# Patient Record
Sex: Male | Born: 1965 | Race: Black or African American | Hispanic: No | Marital: Married | Smoking: Former smoker
Health system: Southern US, Community
[De-identification: ages and names within clinical notes are randomized; demographics above are authoritative.]

## PROBLEM LIST (undated history)

## (undated) DIAGNOSIS — Z973 Presence of spectacles and contact lenses: Secondary | ICD-10-CM

## (undated) DIAGNOSIS — M542 Cervicalgia: Secondary | ICD-10-CM

## (undated) DIAGNOSIS — M549 Dorsalgia, unspecified: Secondary | ICD-10-CM

## (undated) DIAGNOSIS — J45909 Unspecified asthma, uncomplicated: Secondary | ICD-10-CM

## (undated) DIAGNOSIS — M1990 Unspecified osteoarthritis, unspecified site: Secondary | ICD-10-CM

## (undated) DIAGNOSIS — G8929 Other chronic pain: Secondary | ICD-10-CM

## (undated) DIAGNOSIS — T7840XA Allergy, unspecified, initial encounter: Secondary | ICD-10-CM

## (undated) DIAGNOSIS — D869 Sarcoidosis, unspecified: Secondary | ICD-10-CM

## (undated) DIAGNOSIS — K449 Diaphragmatic hernia without obstruction or gangrene: Secondary | ICD-10-CM

## (undated) HISTORY — DX: Cervicalgia: M54.2

## (undated) HISTORY — DX: Allergy, unspecified, initial encounter: T78.40XA

## (undated) HISTORY — DX: Presence of spectacles and contact lenses: Z97.3

## (undated) HISTORY — DX: Diaphragmatic hernia without obstruction or gangrene: K44.9

## (undated) HISTORY — DX: Unspecified osteoarthritis, unspecified site: M19.90

## (undated) HISTORY — DX: Other chronic pain: G89.29

## (undated) HISTORY — PX: CIRCUMCISION: SUR203

## (undated) HISTORY — DX: Dorsalgia, unspecified: M54.9

---

## 1996-06-18 DIAGNOSIS — D869 Sarcoidosis, unspecified: Secondary | ICD-10-CM

## 1996-06-18 HISTORY — DX: Sarcoidosis, unspecified: D86.9

## 2010-07-11 ENCOUNTER — Encounter
Admission: RE | Admit: 2010-07-11 | Discharge: 2010-07-11 | Payer: Self-pay | Source: Home / Self Care | Attending: Specialist | Admitting: Specialist

## 2012-06-29 IMAGING — CR DG CERVICAL SPINE COMPLETE 4+V
5 series · 5 of 5 positions shown · non-contrast
Comparison: None

CLINICAL DATA: Neck pain and left arm numbness.

CERVICAL SPINE - COMPLETE 4+ VIEW

[w c-spine lat]
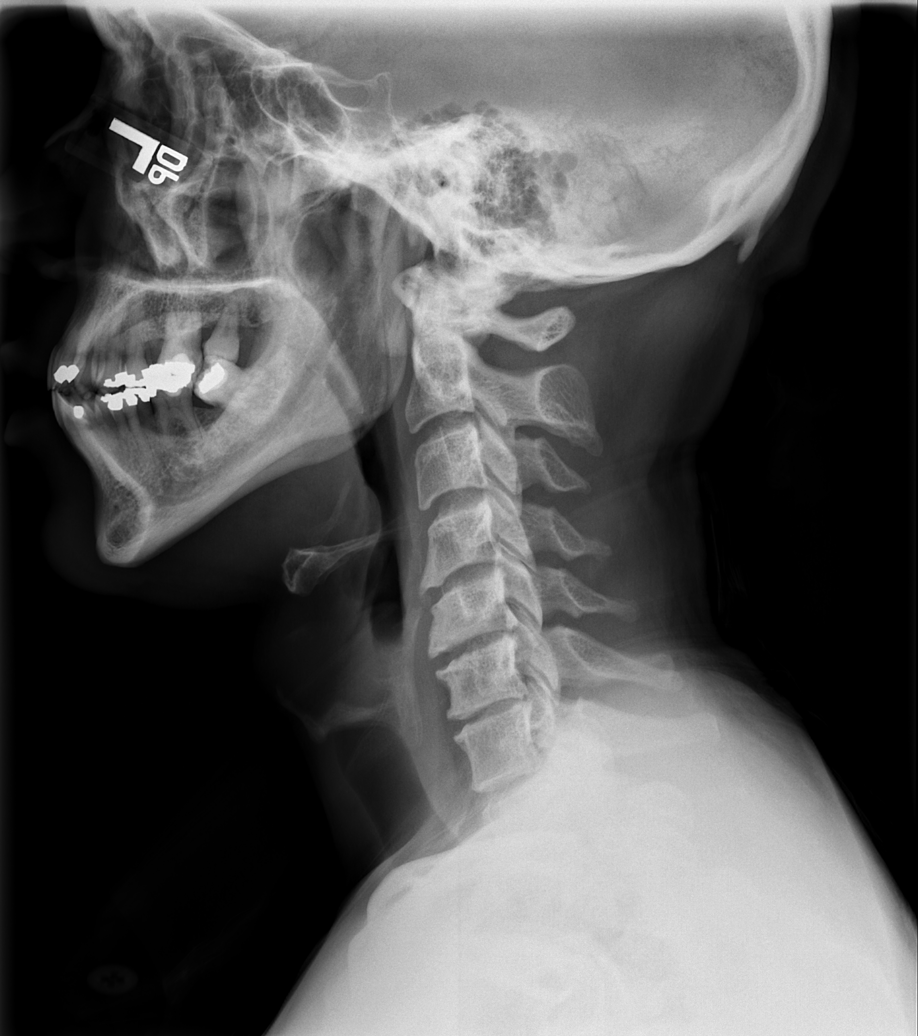

[w c-spine oblique (1 of 2)]
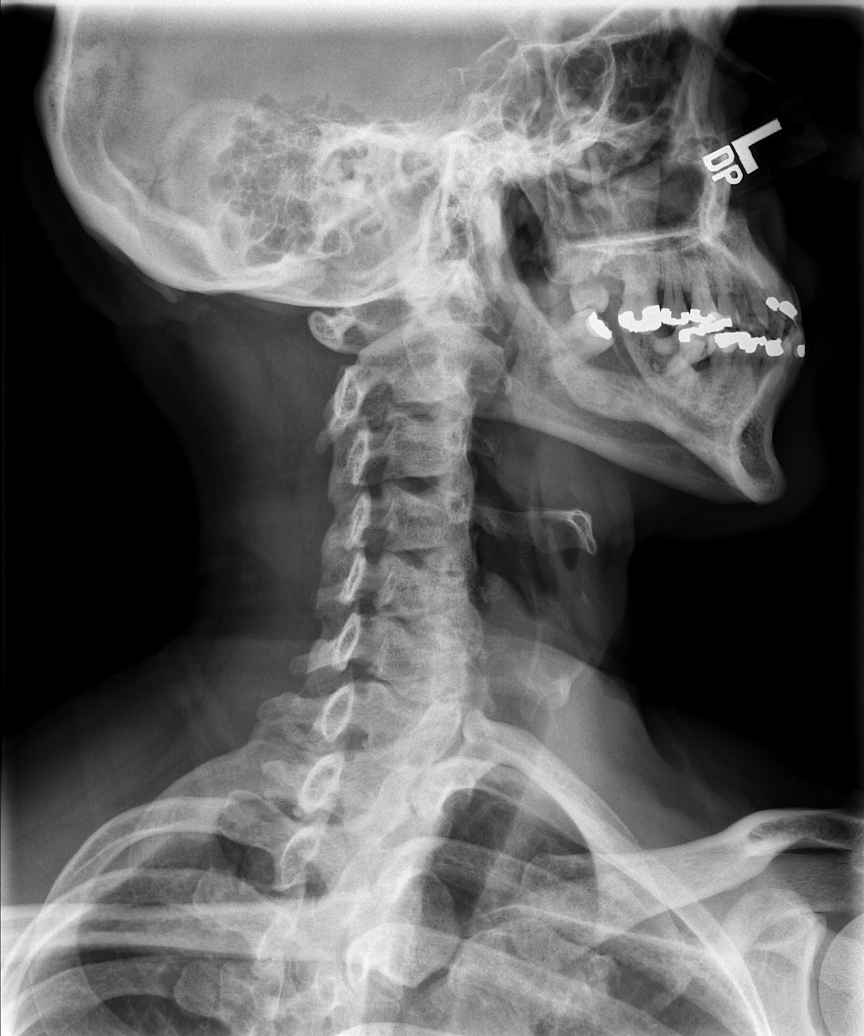

[w c-spine oblique (2 of 2)]
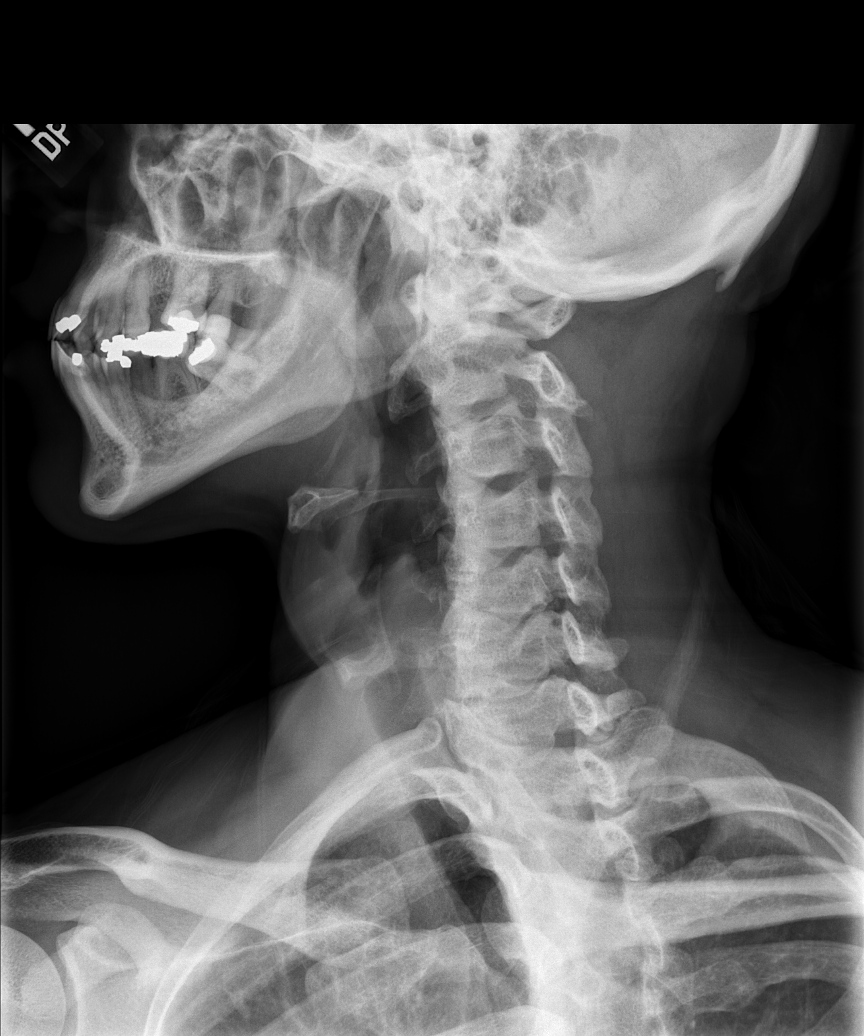

[w c-spine a.p. *]
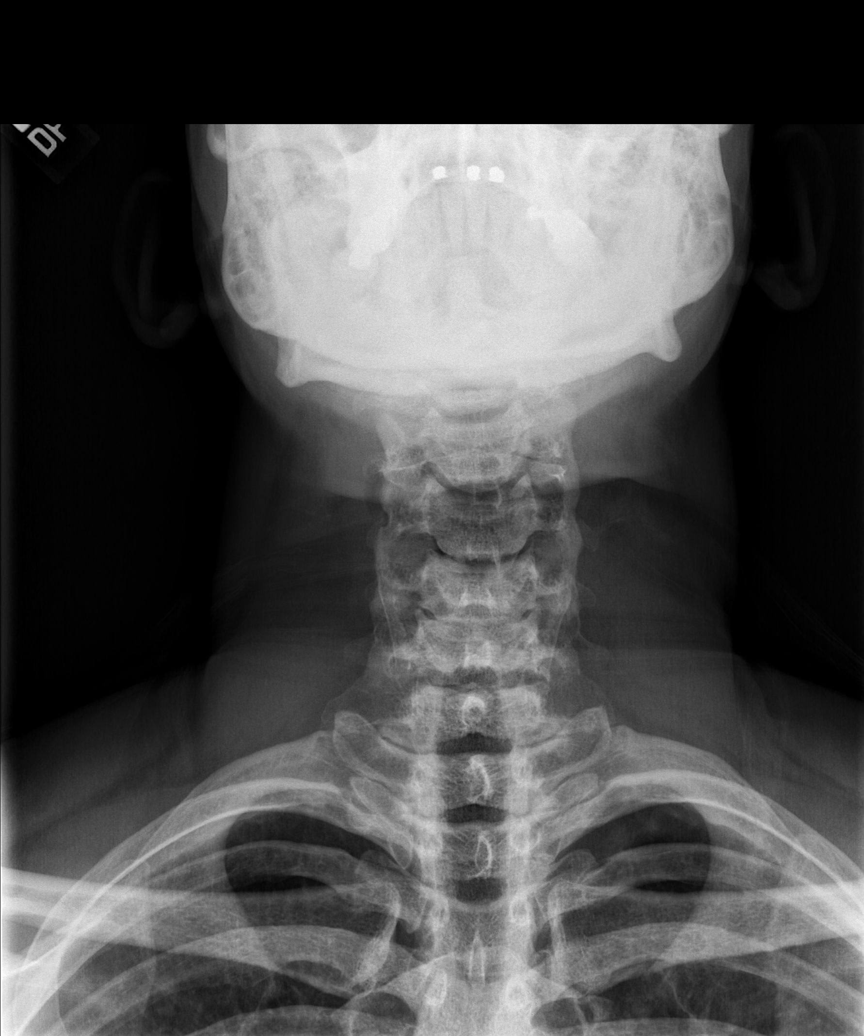

[w c-spine odontoid *]
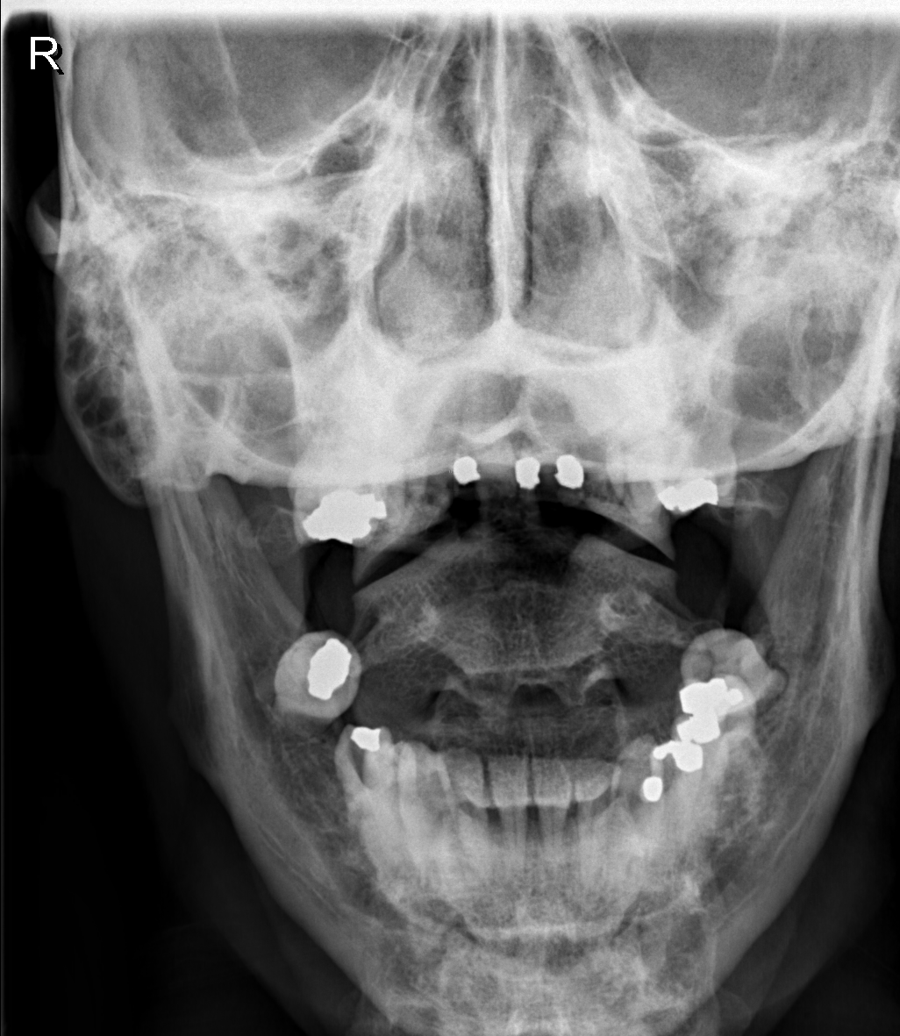

[5 of 5 positions shown; findings below may reference images not displayed]

FINDINGS: Moderate degenerative cervical spondylosis for age with
disc disease and facet disease most notable at C4-5, C5-6 and C6-7.
The facets are normally aligned.  Neural foramen are grossly
patent.  The C1-2 articulations are maintained.  Small cervical
ribs are noted.  The lung apices are clear.
IMPRESSION: Degenerative cervical spondylosis but no acute bony findings.
Uncinate spurring changes at C5-6 and C6-7 could contribute to
foraminal narrowing.

## 2015-06-19 HISTORY — PX: COLONOSCOPY: SHX174

## 2015-12-27 ENCOUNTER — Ambulatory Visit (INDEPENDENT_AMBULATORY_CARE_PROVIDER_SITE_OTHER): Payer: 59

## 2015-12-27 ENCOUNTER — Encounter (HOSPITAL_COMMUNITY): Payer: Self-pay | Admitting: Emergency Medicine

## 2015-12-27 ENCOUNTER — Ambulatory Visit (HOSPITAL_COMMUNITY)
Admission: EM | Admit: 2015-12-27 | Discharge: 2015-12-27 | Disposition: A | Discharge status: Home / Self Care | Payer: 59 | Attending: Family Medicine | Admitting: Family Medicine

## 2015-12-27 DIAGNOSIS — Z79899 Other long term (current) drug therapy: Secondary | ICD-10-CM | POA: Diagnosis not present

## 2015-12-27 DIAGNOSIS — K449 Diaphragmatic hernia without obstruction or gangrene: Secondary | ICD-10-CM | POA: Diagnosis not present

## 2015-12-27 DIAGNOSIS — R1013 Epigastric pain: Secondary | ICD-10-CM | POA: Diagnosis present

## 2015-12-27 DIAGNOSIS — Z833 Family history of diabetes mellitus: Secondary | ICD-10-CM | POA: Diagnosis not present

## 2015-12-27 DIAGNOSIS — Z7722 Contact with and (suspected) exposure to environmental tobacco smoke (acute) (chronic): Secondary | ICD-10-CM | POA: Diagnosis not present

## 2015-12-27 HISTORY — DX: Unspecified asthma, uncomplicated: J45.909

## 2015-12-27 HISTORY — DX: Sarcoidosis, unspecified: D86.9

## 2015-12-27 MED ORDER — ALBUTEROL SULFATE HFA 108 (90 BASE) MCG/ACT IN AERS
1.0000 | INHALATION_SPRAY | Freq: Four times a day (QID) | RESPIRATORY_TRACT | Status: DC | PRN
Start: 2015-12-27 — End: 2016-01-18

## 2015-12-27 MED ORDER — FLUTICASONE-SALMETEROL 250-50 MCG/DOSE IN AEPB: 1.0000 | INHALATION_SPRAY | Freq: Two times a day (BID) | RESPIRATORY_TRACT | Status: DC

## 2015-12-27 MED ORDER — MONTELUKAST SODIUM 10 MG PO TABS: 10.0000 mg | ORAL_TABLET | Freq: Every day | ORAL | Status: DC

## 2015-12-27 NOTE — Discharge Instructions (Signed)
Hiatal Hernia  A hiatal hernia occurs when part of your stomach slides above the muscle that separates your abdomen from your chest (diaphragm). You can be born with a hiatal hernia (congenital), or it may develop over time. In almost all cases of hiatal hernia, only the top part of the stomach pushes through.   Many people have a hiatal hernia with no symptoms. The larger the hernia, the more likely that you will have symptoms. In some cases, a hiatal hernia allows stomach acid to flow back into the tube that carries food from your mouth to your stomach (esophagus). This may cause heartburn symptoms. Severe heartburn symptoms may mean you have developed a condition called gastroesophageal reflux disease (GERD).   CAUSES   Hiatal hernias are caused by a weakness in the opening (hiatus) where your esophagus passes through your diaphragm to attach to the upper part of your stomach. You may be born with a weakness in your hiatus, or a weakness can develop.  RISK FACTORS  Older age is a major risk factor for a hiatal hernia. Anything that increases pressure on your diaphragm can also increase your risk of a hiatal hernia. This includes:  · Pregnancy.  · Excess weight.  · Frequent constipation.  SIGNS AND SYMPTOMS   People with a hiatal hernia often have no symptoms. If symptoms develop, they are almost always caused by GERD. They may include:  · Heartburn.  · Belching.  · Indigestion.  · Trouble swallowing.  · Coughing or wheezing.   · Sore throat.  · Hoarseness.  · Chest pain.  DIAGNOSIS   A hiatal hernia is sometimes found during an exam for another problem. Your health care provider may suspect a hiatal hernia if you have symptoms of GERD. Tests may be done to diagnose GERD. These may include:  · X-rays of your stomach or chest.  · An upper gastrointestinal (GI) series. This is an X-ray exam of your GI tract involving the use of a chalky liquid that you swallow. The liquid shows up clearly on the X-ray.  · Endoscopy.  This is a procedure to look into your stomach using a thin, flexible tube that has a tiny camera and light on the end of it.  TREATMENT   If you have no symptoms, you may not need treatment. If you have symptoms, treatment may include:  · Dietary and lifestyle changes to help reduce GERD symptoms.  · Medicines. These may include:    Over-the-counter antacids.    Medicines that make your stomach empty more quickly.    Medicines that block the production of stomach acid (H2 blockers).    Stronger medicines to reduce stomach acid (proton pump inhibitors).  · You may need surgery to repair the hernia if other treatments are not helping.  HOME CARE INSTRUCTIONS   · Take all medicines as directed by your health care provider.  · Quit smoking, if you smoke.  · Try to achieve and maintain a healthy body weight.  · Eat frequent small meals instead of three large meals a day. This keeps your stomach from getting too full.    Eat slowly.    Do not lie down right after eating.     Do not eat 1-2 hours before bed.    · Do not drink beverages with caffeine. These include cola, coffee, cocoa, and tea.  · Do not drink alcohol.  · Avoid foods that can make symptoms of GERD worse. These may include:    Fatty foods.      Citrus fruits.    Other foods and drinks that contain acid.  · Avoid putting pressure on your belly. Anything that puts pressure on your belly increases the amount of acid that may be pushed up into your esophagus.      Avoid bending over, especially after eating.    Raise the head of your bed by putting blocks under the legs. This keeps your head and esophagus higher than your stomach.    Do not wear tight clothing around your chest or stomach.    Try not to strain when having a bowel movement, when urinating, or when lifting heavy objects.  SEEK MEDICAL CARE IF:  · Your symptoms are not controlled with medicines or lifestyle changes.  · You are having trouble swallowing.  · You have coughing or wheezing that will not  go away.  SEEK IMMEDIATE MEDICAL CARE IF:  · Your pain is getting worse.  · Your pain spreads to your arms, neck, jaw, teeth, or back.  · You have shortness of breath.  · You sweat for no reason.  · You feel sick to your stomach (nauseous) or vomit.  · You vomit blood.  · You have bright red blood in your stools.  · You have black, tarry stools.       This information is not intended to replace advice given to you by your health care provider. Make sure you discuss any questions you have with your health care provider.     Document Released: 08/25/2003 Document Revised: 06/25/2014 Document Reviewed: 05/22/2013  Elsevier Interactive Patient Education ©2016 [employer]

## 2015-12-27 NOTE — ED Provider Notes (Signed)
CSN: [id number]     Arrival date & time 12/27/15  0957 History   First MD Initiated Contact with Patient 12/27/15 1025     Chief Complaint  Patient presents with  . Chest Pain  . Medication Refill   (Consider location/radiation/quality/duration/timing/severity/associated sxs/prior Treatment) HPI History obtained from patient:  Pt presents with the cc of:  Upper epigastric pain Duration of symptoms: 3 weeks Treatment prior to arrival: None Context: Onset of upper epigastric pain 3 weeks ago comes and goes does not keep him from doing any of his normal activities I related to shortness of breath or chest pain. Other symptoms include: Some increased in normal heartburn Pain score: 1 FAMILY HISTORY: Diabetes     Past Medical History  Diagnosis Date  . Asthma   . Sarcoidosis (HCC)    History reviewed. No pertinent past surgical history. Family History  Problem Relation Age of Onset  . Asthma Mother   . Diabetes Mother    Social History  Substance Use Topics  . Smoking status: Passive Smoke Exposure - Never Smoker  . Smokeless tobacco: None  . Alcohol Use: Yes     Comment: occassionally    Review of Systems  Denies: HEADACHE, NAUSEA, ABDOMINAL PAIN, CHEST PAIN, CONGESTION, DYSURIA, SHORTNESS OF BREATH  Allergies  Review of patient's allergies indicates no known allergies.  Home Medications   Prior to Admission medications   Medication Sig Start Date End Date Taking? Authorizing Provider  albuterol (PROVENTIL HFA;VENTOLIN HFA) 108 (90 Base) MCG/ACT inhaler Inhale into the lungs every 6 (six) hours as needed for wheezing or shortness of breath.    Historical Provider, MD  albuterol (PROVENTIL HFA;VENTOLIN HFA) 108 (90 Base) MCG/ACT inhaler Inhale 1-2 puffs into the lungs every 6 (six) hours as needed for wheezing or shortness of breath. 12/27/15   Tharon Aquas, PA  Fluticasone-Salmeterol (ADVAIR DISKUS) 250-50 MCG/DOSE AEPB Inhale 1 puff into the lungs 2 (two) times  daily. 12/27/15   Tharon Aquas, PA  Fluticasone-Salmeterol (ADVAIR) 250-50 MCG/DOSE AEPB Inhale 1 puff into the lungs 2 (two) times daily.    Historical Provider, MD  montelukast (SINGULAIR) 10 MG tablet Take 10 mg by mouth at bedtime.    Historical Provider, MD  montelukast (SINGULAIR) 10 MG tablet Take 1 tablet (10 mg total) by mouth at bedtime. 12/27/15   Tharon Aquas, PA   Meds Ordered and Administered this Visit  Medications - No data to display  BP 113/76 mmHg  Pulse 79  Temp(Src) 98.2 F (36.8 C) (Oral)  Resp 18  SpO2 97% No data found.   Physical Exam NURSES NOTES AND VITAL SIGNS REVIEWED. CONSTITUTIONAL: Well developed, well nourished, no acute distress HEENT: normocephalic, atraumatic EYES: Conjunctiva normal NECK:normal ROM, supple, no adenopathy PULMONARY:No respiratory distress, normal effort ABDOMINAL: Soft, ND, NT BS+, No CVAT MUSCULOSKELETAL: Normal ROM of all extremities,  SKIN: warm and dry without rash PSYCHIATRIC: Mood and affect, behavior are normal  ED Course  Procedures (including critical care time)  Labs Review Labs Reviewed - No data to display  Imaging Review Dg Chest 2 View  12/27/2015  CLINICAL DATA:  Chest pain off and on for 3 weeks. EXAM: CHEST  2 VIEW COMPARISON:  None. FINDINGS: The lungs are clear wiithout focal pneumonia, edema, pneumothorax or pleural effusion. The cardiopericardial silhouette is within normal limits for size. The visualized bony structures of the thorax are intact. IMPRESSION: No active cardiopulmonary disease. Electronically Signed   By: Jamison Oka.D.  On: 12/27/2015 11:22   dISCUSSED WITH PATIENT PRIOR TO DISCHARGE  Visual Acuity Review  Right Eye Distance:   Left Eye Distance:   Bilateral Distance:    Right Eye Near:   Left Eye Near:    Bilateral Near:         MDM   1. Hiatal hernia     Patient is reassured that there are no issues that require transfer to higher level of care at this time  or additional tests. Patient is advised to continue home symptomatic treatment. Patient is advised that if there are new or worsening symptoms to attend the emergency department, contact primary care provider, or return to UC. Instructions of care provided discharged home in stable condition.    THIS NOTE WAS GENERATED USING A VOICE RECOGNITION SOFTWARE PROGRAM. ALL REASONABLE EFFORTS  WERE MADE TO PROOFREAD THIS DOCUMENT FOR ACCURACY.  I have verbally reviewed the discharge instructions with the patient. A printed AVS was given to the patient.  All questions were answered prior to discharge.      Tharon AquasFrank C Patrick, PA 12/27/15 1154

## 2015-12-27 NOTE — ED Notes (Signed)
[facility] a [facility] of sternal chest pain that he describes as faint tightness that changes slightly with inspiration. The patient stated that the chest pain started about 3 weeks ago and happens off and on. The patient also requested that his asthma medications be refilled. The patient also reported a hx of sarcoidosis.

## 2016-01-17 DIAGNOSIS — M542 Cervicalgia: Secondary | ICD-10-CM

## 2016-01-17 HISTORY — DX: Cervicalgia: M54.2

## 2016-01-18 ENCOUNTER — Encounter: Payer: Self-pay | Admitting: Medical

## 2016-01-18 ENCOUNTER — Ambulatory Visit (INDEPENDENT_AMBULATORY_CARE_PROVIDER_SITE_OTHER): Payer: 59 | Admitting: Medical

## 2016-01-18 ENCOUNTER — Encounter: Payer: Self-pay | Admitting: Internal Medicine

## 2016-01-18 VITALS — BP 110/76 | HR 70 | Ht 70.25 in | Wt 185.0 lb

## 2016-01-18 DIAGNOSIS — Z1211 Encounter for screening for malignant neoplasm of colon: Secondary | ICD-10-CM

## 2016-01-18 DIAGNOSIS — J309 Allergic rhinitis, unspecified: Secondary | ICD-10-CM | POA: Insufficient documentation

## 2016-01-18 DIAGNOSIS — M542 Cervicalgia: Secondary | ICD-10-CM | POA: Diagnosis not present

## 2016-01-18 DIAGNOSIS — M549 Dorsalgia, unspecified: Secondary | ICD-10-CM | POA: Diagnosis not present

## 2016-01-18 DIAGNOSIS — J4540 Moderate persistent asthma, uncomplicated: Secondary | ICD-10-CM | POA: Diagnosis not present

## 2016-01-18 DIAGNOSIS — Z862 Personal history of diseases of the blood and blood-forming organs and certain disorders involving the immune mechanism: Secondary | ICD-10-CM | POA: Insufficient documentation

## 2016-01-18 DIAGNOSIS — K449 Diaphragmatic hernia without obstruction or gangrene: Secondary | ICD-10-CM | POA: Diagnosis not present

## 2016-01-18 DIAGNOSIS — Z0000 Encounter for general adult medical examination without abnormal findings: Secondary | ICD-10-CM | POA: Diagnosis not present

## 2016-01-18 DIAGNOSIS — J45909 Unspecified asthma, uncomplicated: Secondary | ICD-10-CM | POA: Insufficient documentation

## 2016-01-18 DIAGNOSIS — G8929 Other chronic pain: Secondary | ICD-10-CM | POA: Insufficient documentation

## 2016-01-18 DIAGNOSIS — Z125 Encounter for screening for malignant neoplasm of prostate: Secondary | ICD-10-CM | POA: Insufficient documentation

## 2016-01-18 DIAGNOSIS — Z7721 Contact with and (suspected) exposure to potentially hazardous body fluids: Secondary | ICD-10-CM | POA: Diagnosis not present

## 2016-01-18 LAB — POCT URINALYSIS DIPSTICK
Bilirubin, UA: NEGATIVE
Blood, UA: NEGATIVE
GLUCOSE UA: NEGATIVE
Ketones, UA: NEGATIVE
Leukocytes, UA: NEGATIVE
NITRITE UA: NEGATIVE
PROTEIN UA: NEGATIVE
SPEC GRAV UA: 1.025
UROBILINOGEN UA: NEGATIVE
pH, UA: 6.5

## 2016-01-18 LAB — COMPREHENSIVE METABOLIC PANEL
ALBUMIN: 4.4 g/dL (ref 3.6–5.1)
ALT: 23 U/L (ref 9–46)
AST: 21 U/L (ref 10–35)
Alkaline Phosphatase: 48 U/L (ref 40–115)
BUN: 11 mg/dL (ref 7–25)
CHLORIDE: 100 mmol/L (ref 98–110)
CO2: 28 mmol/L (ref 20–31)
CREATININE: 1.09 mg/dL (ref 0.70–1.33)
Calcium: 9.3 mg/dL (ref 8.6–10.3)
Glucose, Bld: 75 mg/dL (ref 65–99)
POTASSIUM: 4 mmol/L (ref 3.5–5.3)
SODIUM: 137 mmol/L (ref 135–146)
Total Bilirubin: 0.8 mg/dL (ref 0.2–1.2)
Total Protein: 7.8 g/dL (ref 6.1–8.1)

## 2016-01-18 LAB — CBC
HCT: 43.8 % (ref 38.5–50.0)
Hemoglobin: 14.9 g/dL (ref 13.2–17.1)
MCH: 30.2 pg (ref 27.0–33.0)
MCHC: 34 g/dL (ref 32.0–36.0)
MCV: 88.7 fL (ref 80.0–100.0)
MPV: 10.4 fL (ref 7.5–12.5)
PLATELETS: 251 10*3/uL (ref 140–400)
RBC: 4.94 MIL/uL (ref 4.20–5.80)
RDW: 13.8 % (ref 11.0–15.0)
WBC: 4.6 10*3/uL (ref 4.0–10.5)

## 2016-01-18 LAB — LIPID PANEL
CHOL/HDL RATIO: 2.4 ratio (ref <=5.0)
Cholesterol: 173 mg/dL (ref 125–200)
HDL: 71 mg/dL (ref >=40)
LDL CALC: 89 mg/dL (ref <130)
TRIGLYCERIDES: 65 mg/dL (ref <150)
VLDL: 13 mg/dL (ref <30)

## 2016-01-18 MED ORDER — ALBUTEROL SULFATE HFA 108 (90 BASE) MCG/ACT IN AERS: 2.0000 | INHALATION_SPRAY | Freq: Four times a day (QID) | RESPIRATORY_TRACT | 1 refills | Status: DC | PRN

## 2016-01-18 MED ORDER — FLUTICASONE-SALMETEROL 250-50 MCG/DOSE IN AEPB: 1.0000 | INHALATION_SPRAY | Freq: Two times a day (BID) | RESPIRATORY_TRACT | 11 refills | Status: DC

## 2016-01-18 MED ORDER — MONTELUKAST SODIUM 10 MG PO TABS: 10.0000 mg | ORAL_TABLET | Freq: Every day | ORAL | 3 refills | Status: DC

## 2016-01-18 NOTE — Progress Notes (Signed)
Subjective:   HPI  Seth Ferguson is a 50 y.o. male who presents for a complete physical.  New patient today.  Was seeing Dr. Mayford Knife prior but he disappeared, can't seem to make contact with the office that seems to be closed.   Concerns: Asthma - controlled on Advair, Singulair, no particular c/o, no recent flare ups.  Can get flare up with illness, heavy exercise.  Has hx/o chronic neck and back pain, not debilitating.   Works full time as Sports administrator facility. Thinks vaccines for hepatitis and Td up to date per work.  Also gets some labs from time to time at work  Went to urgent care recently for chest pain, was diagnosed with hiatal hernia.  Not currently having problems with this.  Reviewed their medical, surgical, family, social, medication, and allergy history and updated chart as appropriate.  Past Medical History:  Diagnosis Date  . Allergy   . Asthma   . Chronic back pain    self reported 01/2016  . Hiatal hernia   . Neck pain 01/2016   self reported  . Sarcoidosis (HCC) 1998   ?  Marland Kitchen Wears glasses     Past Surgical History:  Procedure Laterality Date  . NO PAST SURGERIES  01/2016    Social History   Social History  . Marital status: Married    Spouse name: N/A  . Number of children: N/A  . Years of education: N/A   Occupational History  . Not on file.   Social History Main Topics  . Smoking status: Passive Smoke Exposure - Never Smoker  . Smokeless tobacco: Never Used  . Alcohol use Yes     Comment: occassionally  . Drug use: No  . Sexual activity: Not on file   Other Topics Concern  . Not on file   Social History Narrative   Originally from [place], married, has 4 children, 2 boys still living at home, exercise  Active on the job, uses stairs, cuts grass, yard work.   Designer, television/film set at [employer], prior Music therapist.  As of 01/2016    Family History  Problem Relation Age of Onset  . Asthma Mother   . Diabetes Mother   . Other  Mother     died of "broken heart" per son  . Stroke Mother   . Seizures Mother   . Other Father     lung disease?  Marland Kitchen Pneumonia Father   . Asthma Sister   . Diabetes Sister   . Asthma Brother   . Heart disease Neg Hx   . Hypertension Neg Hx   . Cancer Neg Hx      Current Outpatient Prescriptions:  .  Fluticasone-Salmeterol (ADVAIR DISKUS) 250-50 MCG/DOSE AEPB, Inhale 1 puff into the lungs 2 (two) times daily., Disp: 60 each, Rfl: 11 .  montelukast (SINGULAIR) 10 MG tablet, Take 1 tablet (10 mg total) by mouth at bedtime., Disp: 90 tablet, Rfl: 3 .  albuterol (PROVENTIL HFA;VENTOLIN HFA) 108 (90 Base) MCG/ACT inhaler, Inhale 2 puffs into the lungs every 6 (six) hours as needed for wheezing or shortness of breath., Disp: 18 g, Rfl: 1  No Known Allergies    Review of Systems Constitutional: -fever, -chills, -sweats, -unexpected weight change, -decreased appetite, -fatigue Allergy: -sneezing, -itching, -congestion Dermatology: -changing moles, --rash, -lumps ENT: -runny nose, -ear pain, -sore throat, -hoarseness, -sinus pain, -teeth pain, - ringing in ears, -hearing loss, -nosebleeds Cardiology: -chest pain, -palpitations, -swelling, -difficulty breathing when lying flat, -  waking up short of breath Respiratory: -cough, -shortness of breath, -difficulty breathing with exercise or exertion, -wheezing, -coughing up blood Gastroenterology: -abdominal pain, -nausea, -vomiting, -diarrhea, -constipation, -blood in stool, -changes in bowel movement, -difficulty swallowing or eating Hematology: -bleeding, -bruising  Musculoskeletal: -joint aches, -muscle aches, -joint swelling, +back pain, -neck pain, -cramping, -changes in gait Ophthalmology: denies vision changes, eye redness, itching, discharge Urology: -burning with urination, -difficulty urinating, -blood in urine, -urinary frequency, -urgency, -incontinence Neurology: -headache, -weakness, -tingling, -numbness, -memory loss, -falls,  -dizziness Psychology: -depressed mood, -agitation, -sleep problems     Objective:   Physical Exam  BP 110/76   Pulse 70   Ht 5' 10.25" (1.784 m)   Wt 185 lb (83.9 kg)   BMI 26.36 kg/m   Depression screen [facility] 2/9 01/18/2016  Decreased Interest 0  Down, Depressed, Hopeless 0  PHQ - 2 Score 0    General appearance: alert, no distress, WD/WN, pleasant AA male Skin:no worrisome lesions HEENT: normocephalic, conjunctiva/corneas normal, sclerae anicteric, PERRLA, EOMi, nares patent, no discharge or erythema, pharynx normal Oral cavity: MMM, tongue normal, teeth with some decay Neck: supple, no lymphadenopathy, no thyromegaly, no masses, normal ROM, no bruits Chest: non tender, normal shape and expansion Heart: RRR, normal S1, S2, no murmurs Lungs: CTA bilaterally, no wheezes, rhonchi, or rales Abdomen: +bs, soft, non tender, non distended, no masses, no hepatomegaly, no splenomegaly, no bruits Back: non tender, normal ROM, no scoliosis Musculoskeletal: upper extremities non tender, no obvious deformity, normal ROM throughout, lower extremities non tender, no obvious deformity, normal ROM throughout Extremities: no edema, no cyanosis, no clubbing Pulses: 2+ symmetric, upper and lower extremities, normal cap refill Neurological: alert, oriented x 3, CN2-12 intact, strength normal upper extremities and lower extremities, sensation normal throughout, DTRs 2+ throughout, no cerebellar signs, gait normal Psychiatric: normal affect, behavior normal, pleasant  GU: normal male external genitalia, circumcised, nontender, no masses, no hernia, no lymphadenopathy Rectal: anus normal tone, prostate WNL, no nodules   Assessment and Plan :    Encounter Diagnoses  Name Primary?  . Encounter for health maintenance examination in adult Yes  . History of sarcoidosis   . Asthma, chronic, moderate persistent, uncomplicated   . Allergic rhinitis, unspecified allergic rhinitis type   . Screening for  prostate cancer   . Special screening for malignant neoplasms, colon   . Chronic back pain   . Chronic neck pain   . Hiatal hernia   . Contact with potentially hazardous body fluids     Physical exam - discussed healthy lifestyle, diet, exercise, preventative care, vaccinations, and addressed their concerns.   See your eye doctor yearly for routine vision care. See your dentist yearly for routine dental care including hygiene visits twice yearly. Referral to GI for first colonoscopy Prostate baseline screen today Routine labs today Asthma - controlled on current medication He will get me copy of labs and vaccine records from work. Follow-up pending labs  Maxton was seen today for new patient (initial visit).  Diagnoses and all orders for this visit:  Encounter for health maintenance examination in adult -     Ambulatory referral to Gastroenterology -     Comprehensive metabolic panel -     Lipid panel -     CBC -     PSA -     Sedimentation rate -     Tympanometry -     Visual acuity screening -     POCT urinalysis dipstick  History of sarcoidosis -  Sedimentation rate  Asthma, chronic, moderate persistent, uncomplicated  Allergic rhinitis, unspecified allergic rhinitis type  Screening for prostate cancer -     PSA  Special screening for malignant neoplasms, colon -     Ambulatory referral to Gastroenterology  Chronic back pain  Chronic neck pain  Hiatal hernia  Contact with potentially hazardous body fluids -     HIV antibody -     Hepatitis C antibody -     Hepatitis B surface antibody -     Hepatitis B surface antigen  Other orders -     montelukast (SINGULAIR) 10 MG tablet; Take 1 tablet (10 mg total) by mouth at bedtime. -     Fluticasone-Salmeterol (ADVAIR DISKUS) 250-50 MCG/DOSE AEPB; Inhale 1 puff into the lungs 2 (two) times daily. -     albuterol (PROVENTIL HFA;VENTOLIN HFA) 108 (90 Base) MCG/ACT inhaler; Inhale 2 puffs into the lungs every 6  (six) hours as needed for wheezing or shortness of breath.

## 2016-01-19 LAB — HEPATITIS B SURFACE ANTIGEN: Hepatitis B Surface Ag: NEGATIVE

## 2016-01-19 LAB — SEDIMENTATION RATE: Sed Rate: 5 mm/hr (ref 0–15)

## 2016-01-19 LAB — HEPATITIS B SURFACE ANTIBODY, QUANTITATIVE: HEPATITIS B-POST: 383 m[IU]/mL

## 2016-01-19 LAB — HIV ANTIBODY (ROUTINE TESTING W REFLEX): HIV: NONREACTIVE

## 2016-01-19 LAB — HEPATITIS C ANTIBODY: HCV AB: NEGATIVE

## 2016-01-19 LAB — PSA: PSA: 0.31 ng/mL (ref <=4.00)

## 2016-01-30 ENCOUNTER — Telehealth: Payer: Self-pay | Admitting: Medical

## 2016-01-30 NOTE — Telephone Encounter (Signed)
I called patient.  If he calls back, get me so I can talk to him instead of playing phone tag.

## 2016-03-22 ENCOUNTER — Telehealth: Payer: Self-pay | Admitting: *Deleted

## 2016-03-22 ENCOUNTER — Ambulatory Visit (AMBULATORY_SURGERY_CENTER): Payer: Self-pay | Admitting: *Deleted

## 2016-03-22 ENCOUNTER — Telehealth: Payer: Self-pay | Admitting: Internal Medicine

## 2016-03-22 VITALS — Ht 72.0 in | Wt 198.0 lb

## 2016-03-22 DIAGNOSIS — Z1211 Encounter for screening for malignant neoplasm of colon: Secondary | ICD-10-CM

## 2016-03-22 MED ORDER — PEG 3350-KCL-NA BICARB-NACL 420 G PO SOLR: 4000.0000 mL | Freq: Once | ORAL | 0 refills | Status: AC

## 2016-03-22 MED ORDER — NA SULFATE-K SULFATE-MG SULF 17.5-3.13-1.6 GM/177ML PO SOLN
1.0000 | Freq: Once | ORAL | 0 refills | Status: AC
Start: 2016-03-22 — End: 2016-03-22

## 2016-03-22 NOTE — Telephone Encounter (Signed)
No available suprep samples- per Dr Lauro FranklinPyrtle's CMA dottie , use golytely- script sent to pharmacy, new instructions to pt via mail, LM on voice mail of new script to pharmacy, new instructions to be mailed and call with questions to [phone]  Hilda LiasMarie PV

## 2016-03-22 NOTE — Progress Notes (Signed)
No egg or soy allergy known to patient  No issues with past sedation with any surgeries  or procedures, no intubation problems  No diet pills per patient No home 02 use per patient  No blood thinners per patient  Pt denies issues with constipation  No A fib or A flutter   No email address for emmi

## 2016-03-22 NOTE — Telephone Encounter (Signed)
Pt was seen in Pv today- has [employer] insurace and given 30% off coupon- he has called back stating is 47.00 with the coupon but he still cannot swing the prep price as he is the only one paying bills in his house hold.  Sent staff message to [place] about obtaining a free prep for him. Instructed the pt he should get a call from [place] by next week- if no call by Thursday 10-12, call us back    pt verbalized understanding  Hilda LiasMarie PV

## 2016-03-26 ENCOUNTER — Encounter: Payer: Self-pay | Admitting: Internal Medicine

## 2016-04-02 ENCOUNTER — Encounter: Payer: Self-pay | Admitting: Internal Medicine

## 2016-04-02 ENCOUNTER — Ambulatory Visit (AMBULATORY_SURGERY_CENTER): Payer: 59 | Admitting: Internal Medicine

## 2016-04-02 VITALS — BP 128/95 | HR 68 | Temp 96.8°F | Resp 13 | Ht 72.0 in | Wt 198.0 lb

## 2016-04-02 DIAGNOSIS — Z1211 Encounter for screening for malignant neoplasm of colon: Secondary | ICD-10-CM

## 2016-04-02 DIAGNOSIS — Z1212 Encounter for screening for malignant neoplasm of rectum: Secondary | ICD-10-CM | POA: Diagnosis not present

## 2016-04-02 MED ORDER — SODIUM CHLORIDE 0.9 % IV SOLN
500.0000 mL | INTRAVENOUS | Status: DC
Start: 2016-04-02 — End: 2019-11-11

## 2016-04-02 NOTE — Op Note (Signed)
[facility] Patient Name: Seth Ferguson Procedure Date: 04/02/2016 9:26 AM MRN: [id number] Endoscopist: Beverley Fiedler , MD Age: 50 Referring MD:  Date of Birth: [birth year] Gender: Male Account #: [id number] Procedure:                Colonoscopy Indications:              Screening for colorectal malignant neoplasm, This                            is the patient's first colonoscopy Medicines:                Monitored Anesthesia Care Procedure:                Pre-Anesthesia Assessment:                           - Prior to the procedure, a History and Physical                            was performed, and patient medications and                            allergies were reviewed. The patient's tolerance of                            previous anesthesia was also reviewed. The risks                            and benefits of the procedure and the sedation                            options and risks were discussed with the patient.                            All questions were answered, and informed consent                            was obtained. Prior Anticoagulants: The patient has                            taken no previous anticoagulant or antiplatelet                            agents. ASA Grade Assessment: II - A patient with                            mild systemic disease. After reviewing the risks                            and benefits, the patient was deemed in                            satisfactory condition to undergo the procedure.  After obtaining informed consent, the colonoscope                            was passed under direct vision. Throughout the                            procedure, the patient's blood pressure, pulse, and                            oxygen saturations were monitored continuously. The                            Model CF-HQ190L [id number]) scope was introduced                            through the anus and  advanced to the the cecum,                            identified by appendiceal orifice and ileocecal                            valve. The colonoscopy was performed without                            difficulty. The patient tolerated the procedure                            well. The quality of the bowel preparation was                            good. The ileocecal valve, appendiceal orifice, and                            rectum were photographed. Scope In: 9:40:20 AM Scope Out: 9:53:39 AM Scope Withdrawal Time: 0 hours 11 minutes 10 seconds  Total Procedure Duration: 0 hours 13 minutes 19 seconds  Findings:                 The digital rectal exam was normal.                           A few small-mouthed diverticula were found in the                            sigmoid colon and descending colon.                           Internal hemorrhoids were found during                            retroflexion. The hemorrhoids were small.                           The exam was otherwise without abnormality. Complications:            No  immediate complications. Estimated Blood Loss:     Estimated blood loss: none. Impression:               - Very mild diverticulosis in the sigmoid colon and                            in the descending colon.                           - Internal hemorrhoids.                           - The examination was otherwise normal.                           - No specimens collected. Recommendation:           - Patient has a contact number available for                            emergencies. The signs and symptoms of potential                            delayed complications were discussed with the                            patient. Return to normal activities tomorrow.                            Written discharge instructions were provided to the                            patient.                           - Resume previous diet.                           - Continue  present medications.                           - Repeat colonoscopy in 10 years for screening                            purposes. Beverley FiedlerJay M Rony Ratz, MD 04/02/2016 9:56:36 AM This report has been signed electronically.

## 2016-04-02 NOTE — Progress Notes (Signed)
Report to PACU, RN, vss, BBS= Clear.  

## 2016-04-02 NOTE — Patient Instructions (Signed)
Discharge instructions given. Handouts on diverticulosis and hemorrhoids. Resume previous medications. YOU HAD AN ENDOSCOPIC PROCEDURE TODAY AT THE [facility]:   Refer to the procedure report that was given to you for any specific questions about what was found during the examination.  If the procedure report does not answer your questions, please call your gastroenterologist to clarify.  If you requested that your care partner not be given the details of your procedure findings, then the procedure report has been included in a sealed envelope for you to review at your convenience later.  YOU SHOULD EXPECT: Some feelings of bloating in the abdomen. Passage of more gas than usual.  Walking can help get rid of the air that was put into your GI tract during the procedure and reduce the bloating. If you had a lower endoscopy (such as a colonoscopy or flexible sigmoidoscopy) you may notice spotting of blood in your stool or on the toilet paper. If you underwent a bowel prep for your procedure, you may not have a normal bowel movement for a few days.  Please Note:  You might notice some irritation and congestion in your nose or some drainage.  This is from the oxygen used during your procedure.  There is no need for concern and it should clear up in a day or so.  SYMPTOMS TO REPORT IMMEDIATELY:   Following lower endoscopy (colonoscopy or flexible sigmoidoscopy):  Excessive amounts of blood in the stool  Significant tenderness or worsening of abdominal pains  Swelling of the abdomen that is new, acute  Fever of 100F or higher   For urgent or emergent issues, a gastroenterologist can be reached at any hour by calling [phone].   DIET:  We do recommend a small meal at first, but then you may proceed to your regular diet.  Drink plenty of fluids but you should avoid alcoholic beverages for 24 hours.  ACTIVITY:  You should plan to take it easy for the rest of today and you should  NOT DRIVE or use heavy machinery until tomorrow (because of the sedation medicines used during the test).    FOLLOW UP: Our staff will call the number listed on your records the next business day following your procedure to check on you and address any questions or concerns that you may have regarding the information given to you following your procedure. If we do not reach you, we will leave a message.  However, if you are feeling well and you are not experiencing any problems, there is no need to return our call.  We will assume that you have returned to your regular daily activities without incident.  If any biopsies were taken you will be contacted by phone or by letter within the next 1-3 weeks.  Please call us at [phone] if you have not heard about the biopsies in 3 weeks.    SIGNATURES/CONFIDENTIALITY: You and/or your care partner have signed paperwork which will be entered into your electronic medical record.  These signatures attest to the fact that that the information above on your After Visit Summary has been reviewed and is understood.  Full responsibility of the confidentiality of this discharge information lies with you and/or your care-partner. 

## 2016-04-03 ENCOUNTER — Telehealth: Payer: Self-pay | Admitting: *Deleted

## 2016-04-03 ENCOUNTER — Telehealth: Payer: Self-pay

## 2016-04-03 NOTE — Telephone Encounter (Signed)
  Follow up Call-  Call back number 04/02/2016  Post procedure Call Back phone  # [phone]  Permission to leave phone message Yes  Some recent data might be hidden     Patient questions:  Do you have a fever, pain , or abdominal swelling? No. Pain Score  0 *  Have you tolerated food without any problems? Yes.    Have you been able to return to your normal activities? Yes.    Do you have any questions about your discharge instructions: Diet   No. Medications  No. Follow up visit  No.  Do you have questions or concerns about your Care? No.  Actions: * If pain score is 4 or above: No action needed, pain <4.

## 2016-04-03 NOTE — Telephone Encounter (Signed)
Follow-up, number identifier, left message

## 2016-10-15 ENCOUNTER — Encounter: Payer: Self-pay | Admitting: Family Medicine

## 2016-10-15 ENCOUNTER — Ambulatory Visit (INDEPENDENT_AMBULATORY_CARE_PROVIDER_SITE_OTHER): Payer: 59 | Admitting: Family Medicine

## 2016-10-15 VITALS — BP 120/78 | HR 70 | Temp 97.8°F | Resp 16 | Wt 202.0 lb

## 2016-10-15 DIAGNOSIS — R1013 Epigastric pain: Secondary | ICD-10-CM

## 2016-10-15 DIAGNOSIS — Z8719 Personal history of other diseases of the digestive system: Secondary | ICD-10-CM | POA: Diagnosis not present

## 2016-10-15 LAB — CBC WITH DIFFERENTIAL/PLATELET
BASOS ABS: 0 {cells}/uL (ref 0–200)
Basophils Relative: 0 %
EOS PCT: 9 %
Eosinophils Absolute: 450 cells/uL (ref 15–500)
HCT: 43.7 % (ref 38.5–50.0)
Hemoglobin: 14.6 g/dL (ref 13.2–17.1)
LYMPHS PCT: 41 %
Lymphs Abs: 2050 cells/uL (ref 850–3900)
MCH: 30.4 pg (ref 27.0–33.0)
MCHC: 33.4 g/dL (ref 32.0–36.0)
MCV: 90.9 fL (ref 80.0–100.0)
MONOS PCT: 10 %
MPV: 10.3 fL (ref 7.5–12.5)
Monocytes Absolute: 500 cells/uL (ref 200–950)
NEUTROS PCT: 40 %
Neutro Abs: 2000 cells/uL (ref 1500–7800)
PLATELETS: 261 10*3/uL (ref 140–400)
RBC: 4.81 MIL/uL (ref 4.20–5.80)
RDW: 14.3 % (ref 11.0–15.0)
WBC: 5 10*3/uL (ref 4.0–10.5)

## 2016-10-15 LAB — COMPREHENSIVE METABOLIC PANEL
ALBUMIN: 4.3 g/dL (ref 3.6–5.1)
ALT: 48 U/L — ABNORMAL HIGH (ref 9–46)
AST: 35 U/L (ref 10–35)
Alkaline Phosphatase: 53 U/L (ref 40–115)
BUN: 14 mg/dL (ref 7–25)
CHLORIDE: 100 mmol/L (ref 98–110)
CO2: 26 mmol/L (ref 20–31)
CREATININE: 1.04 mg/dL (ref 0.70–1.33)
Calcium: 9.5 mg/dL (ref 8.6–10.3)
Glucose, Bld: 94 mg/dL (ref 65–99)
Potassium: 4.6 mmol/L (ref 3.5–5.3)
SODIUM: 137 mmol/L (ref 135–146)
Total Bilirubin: 0.5 mg/dL (ref 0.2–1.2)
Total Protein: 7.9 g/dL (ref 6.1–8.1)

## 2016-10-15 NOTE — Patient Instructions (Signed)
Your pain appears to be related to a musculoskeletal issue. Pain with movement certainly speaks to this.  I recommend taking 2 Aleve twice daily with food for the next week.   If your pain is not relieved with the Aleve or gets worse then you may want to try an over the counter acid medication such as Omeprazole. Your pain does not seem to be worse with eating or drinking at this point however.   We will call you with your lab results.

## 2016-10-15 NOTE — Progress Notes (Signed)
Subjective:    Patient ID: Seth Ferguson, male    DOB: Jun 02, 1966, 51 y.o.   MRN: [id number]  HPI Chief Complaint  Patient presents with  . hernia    hernia and pain under breast bone   He is here with complaints of a 2 day history of intermittent upper epigastric pain with a history of hiatal hernia, asthma and sarcoidosis.  States he was leaning on a desk this morning at the [facility] and the pain became a constant pressure just below his rib cage. States pain improved after standing upright for a few minutes. Pain is non radiating and is made worse with deep breathing, laughing and movement. States he last ate around 7pm last night states this did not aggravate his symptoms. Reports sleeping well last night and did not notice pain while sleeping.  States he has been on vacation for the past 6 weeks and has been drinking more alcohol than usual. He drank beer yesterday prior to onset of pain. No recent NSAIDs.  States he has been eating spicy foods and this does not aggravate his pain. Denies history of GERD. Has never taken medication for acid reflux.   Denies fever, chills, dizziness, dysphagia, chest pain, palpitations, shortness of breath, nausea, vomiting, diarrhea or constipation.  Normal bowel movement this morning per patient.   States asthma has not been bothersome. States he stopped advair and singulair months ago.  Has used albuterol inhaler 1-2 times in past 2 months. States he does not like to take medication.   It is not clear how he was diagnosed with a hiatal hernia. No barium swallow in the past.   Denies smoking, drug use. Drinks alcohol occasionally.   No personal or family history of cardiac disease.   Reviewed allergies, medications, past medical, surgical, family, and social history.   Review of Systems Pertinent positives and negatives in the history of present illness.     Objective:   Physical Exam  Constitutional: He is oriented to person, place, and  time. He appears well-nourished. He does not have a sickly appearance. No distress.  HENT:  Mouth/Throat: Uvula is midline, oropharynx is clear and moist and mucous membranes are normal.  Eyes: Conjunctivae are normal. Pupils are equal, round, and reactive to light.  Neck: Normal range of motion. Neck supple. No JVD present. No thyromegaly present.  Cardiovascular: Normal rate, regular rhythm, normal heart sounds and normal pulses.  Exam reveals no gallop and no friction rub.   No murmur heard. Pulmonary/Chest: Effort normal and breath sounds normal. He exhibits no tenderness.  Abdominal: Soft. Normal appearance and bowel sounds are normal. There is no hepatosplenomegaly. There is tenderness in the epigastric area. There is no rigidity, no rebound, no guarding, no CVA tenderness, no tenderness at McBurney's point and negative Murphy's sign.  Lymphadenopathy:    He has no cervical adenopathy.       Right: No supraclavicular adenopathy present.       Left: No supraclavicular adenopathy present.  Neurological: He is alert and oriented to person, place, and time. He has normal strength. No cranial nerve deficit or sensory deficit. Coordination and gait normal.  Skin: Skin is warm and dry. No rash noted. He is not diaphoretic. No cyanosis. No pallor.  Psychiatric: He has a normal mood and affect. His speech is normal and behavior is normal. Thought content normal.   BP 120/78   Pulse 70   Temp 97.8 F (36.6 C) (Oral)   Resp 16  Wt 202 lb (91.6 kg)   SpO2 98%   BMI 27.40 kg/m       Assessment & Plan:  Epigastric pain - Plan: CBC with Differential/Platelet, Comprehensive metabolic panel, EKG 12-Lead, Lipase, Amylase  History of hiatal hernia  Reviewed UC and recent office notes. Normal CXR in July 2017.  Discussed that his symptoms appear to be related to a musculoskeletal etiology and unlikely to be related to a cardiopulmonary issue. Plan to have him try a short course of NSAIDs such  as 2 Aleve twice daily with food.  ECG shows some early repolarization but unremarkable otherwise and reviewed by myself and Dr. Susann Givens. Will check labs to rule out underlying etiology as well.  No obvious GERD symptoms, pain is unchanged with eating and drinking.  If he is not improving with Aleve he may want to try a PPI however.  Advised that if pain is not improving with medication, worsens or any new symptoms arise then he should follow up.

## 2016-10-16 LAB — AMYLASE: AMYLASE: 63 U/L (ref 0–105)

## 2016-10-16 LAB — LIPASE: Lipase: 30 U/L (ref 7–60)

## 2017-12-15 IMAGING — DX DG CHEST 2V
2 series · 2 of 2 positions shown · non-contrast
Comparison: None.

CLINICAL DATA: Chest pain off and on for 3 weeks.

EXAM:
CHEST  2 VIEW

[chest pa]
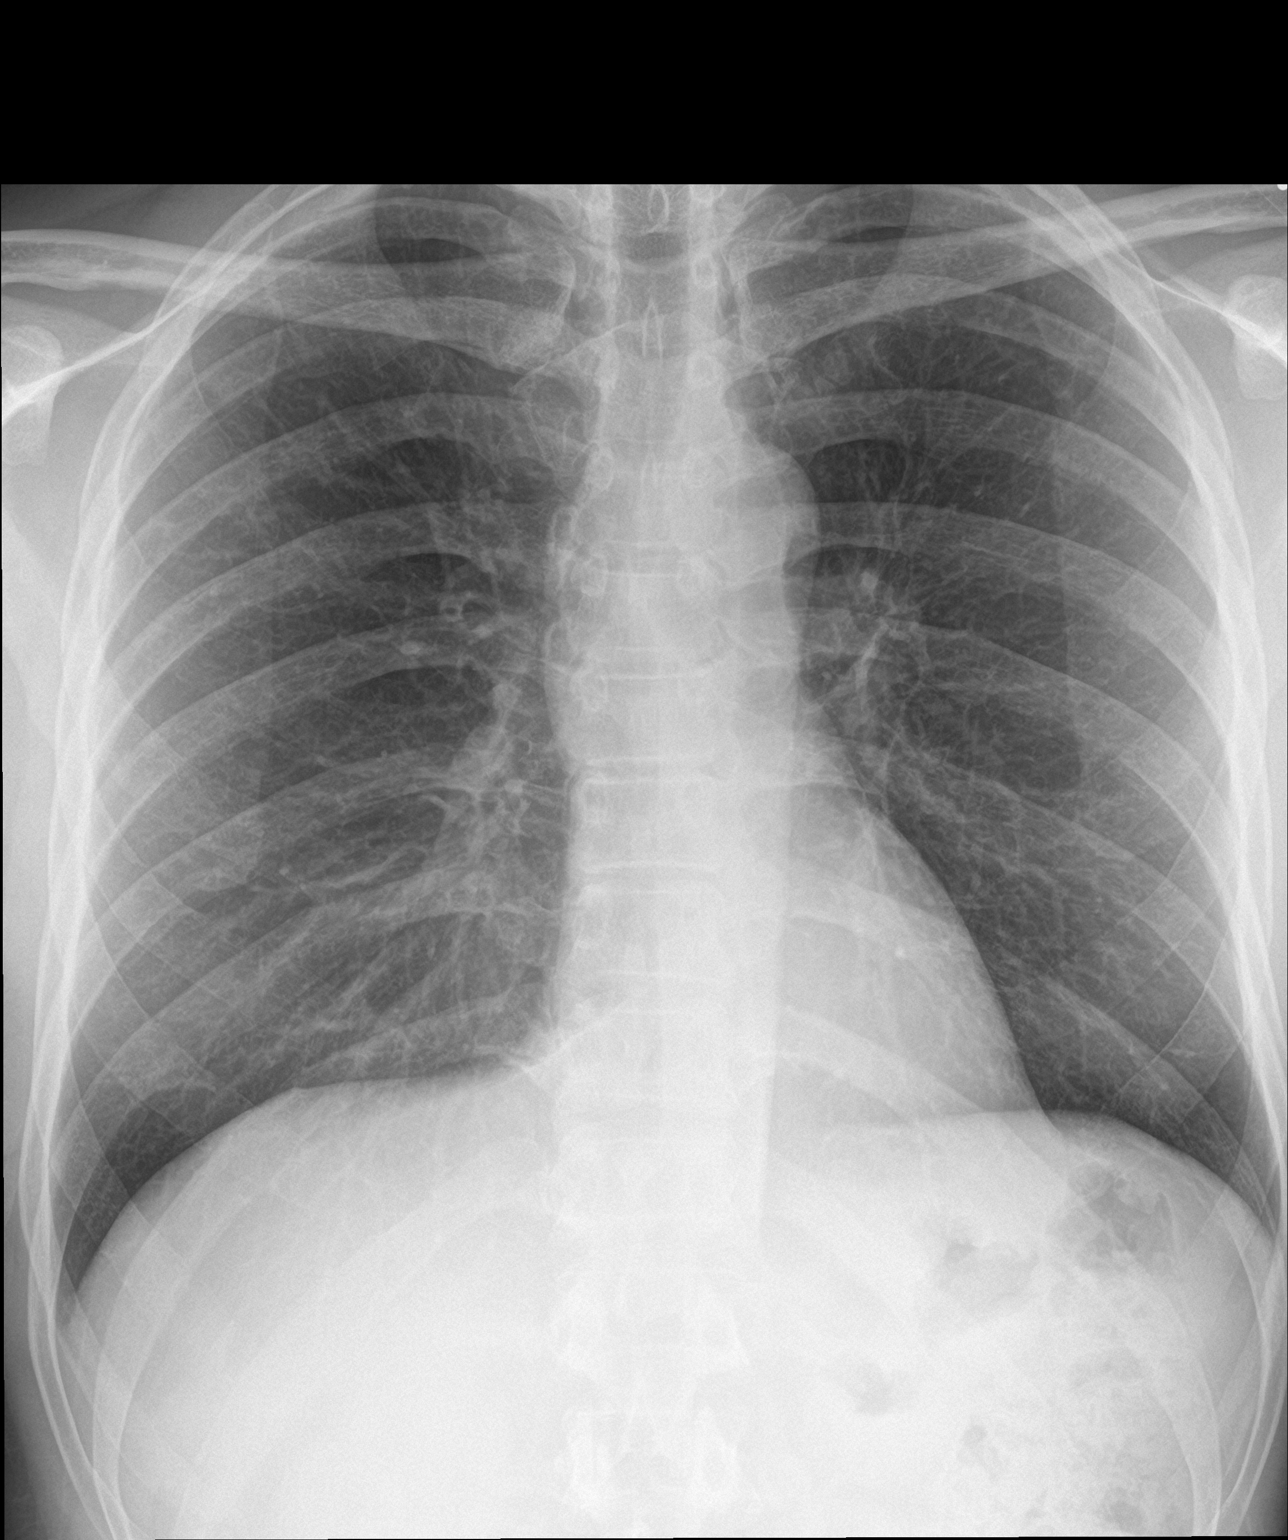

[chest lat]
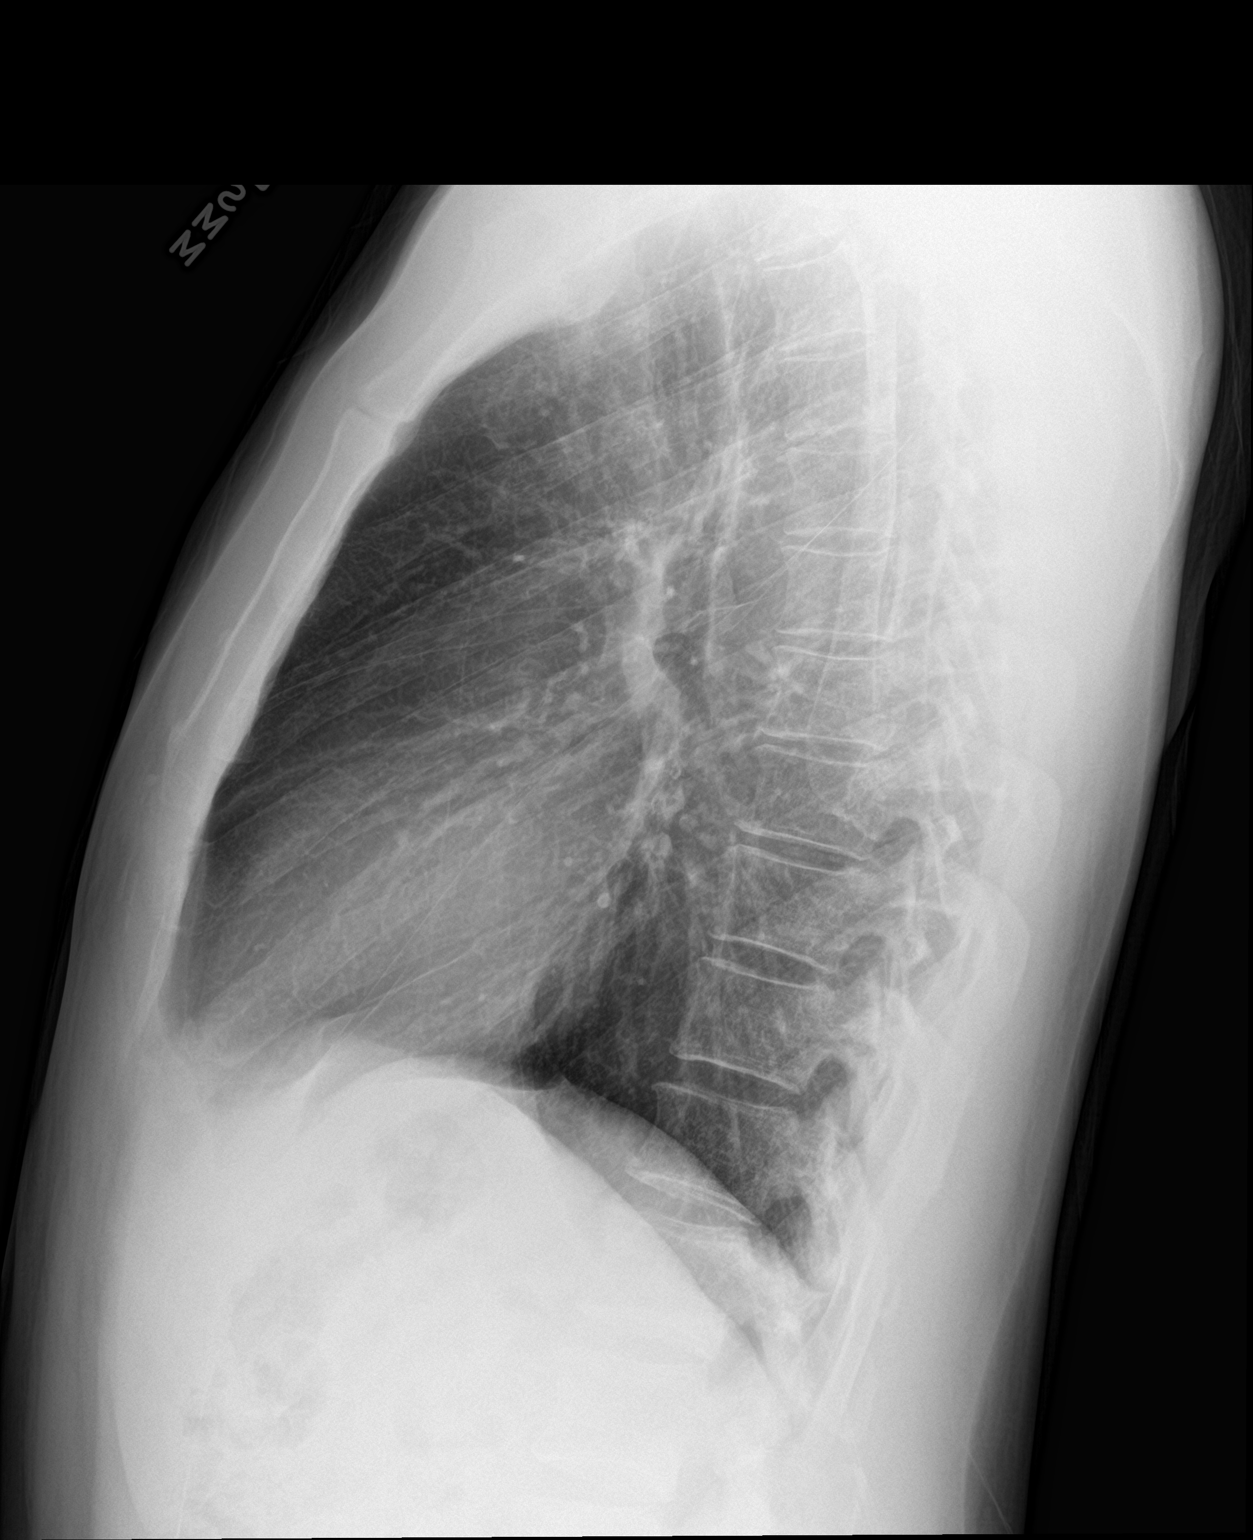

[2 of 2 positions shown; findings below may reference images not displayed]

FINDINGS: The lungs are clear wiithout focal pneumonia, edema, pneumothorax or
pleural effusion. The cardiopericardial silhouette is within normal
limits for size. The visualized bony structures of the thorax are
intact.
IMPRESSION: No active cardiopulmonary disease.

## 2019-01-16 ENCOUNTER — Ambulatory Visit: Payer: 59 | Admitting: Medical

## 2019-01-16 ENCOUNTER — Encounter: Payer: Self-pay | Admitting: Medical

## 2019-01-16 ENCOUNTER — Other Ambulatory Visit: Payer: Self-pay

## 2019-01-16 DIAGNOSIS — J4540 Moderate persistent asthma, uncomplicated: Secondary | ICD-10-CM

## 2019-01-16 DIAGNOSIS — Z20822 Contact with and (suspected) exposure to covid-19: Secondary | ICD-10-CM

## 2019-01-16 MED ORDER — FLUTICASONE-SALMETEROL 250-50 MCG/DOSE IN AEPB: 1.0000 | INHALATION_SPRAY | Freq: Two times a day (BID) | RESPIRATORY_TRACT | 0 refills | Status: DC

## 2019-01-16 MED ORDER — MONTELUKAST SODIUM 10 MG PO TABS: 10.0000 mg | ORAL_TABLET | Freq: Every day | ORAL | 0 refills | Status: DC

## 2019-01-16 MED ORDER — ALBUTEROL SULFATE HFA 108 (90 BASE) MCG/ACT IN AERS: 2.0000 | INHALATION_SPRAY | Freq: Four times a day (QID) | RESPIRATORY_TRACT | 1 refills | Status: DC | PRN

## 2019-01-18 LAB — NOVEL CORONAVIRUS, NAA: SARS-CoV-2, NAA: DETECTED — AB

## 2019-01-19 NOTE — Progress Notes (Signed)
error 

## 2019-01-27 ENCOUNTER — Other Ambulatory Visit: Payer: Self-pay

## 2019-01-27 DIAGNOSIS — Z20822 Contact with and (suspected) exposure to covid-19: Secondary | ICD-10-CM

## 2019-01-28 LAB — NOVEL CORONAVIRUS, NAA: SARS-CoV-2, NAA: NOT DETECTED

## 2019-02-14 ENCOUNTER — Other Ambulatory Visit: Payer: Self-pay | Admitting: Medical

## 2019-02-14 DIAGNOSIS — J4540 Moderate persistent asthma, uncomplicated: Secondary | ICD-10-CM

## 2019-03-18 ENCOUNTER — Other Ambulatory Visit: Payer: Self-pay | Admitting: Medical

## 2019-03-18 DIAGNOSIS — J4540 Moderate persistent asthma, uncomplicated: Secondary | ICD-10-CM

## 2019-04-14 ENCOUNTER — Other Ambulatory Visit: Payer: Self-pay | Admitting: Medical

## 2019-04-14 ENCOUNTER — Telehealth: Payer: Self-pay | Admitting: Medical

## 2019-04-14 DIAGNOSIS — J4540 Moderate persistent asthma, uncomplicated: Secondary | ICD-10-CM

## 2019-04-14 NOTE — Telephone Encounter (Signed)
Called and left pt a Vm

## 2019-04-14 NOTE — Telephone Encounter (Signed)
Please schedule fasting physical

## 2019-08-03 ENCOUNTER — Other Ambulatory Visit: Payer: Self-pay | Admitting: Medical

## 2019-08-03 DIAGNOSIS — J4540 Moderate persistent asthma, uncomplicated: Secondary | ICD-10-CM

## 2019-08-04 NOTE — Telephone Encounter (Signed)
I received a refill request.  Please call and verify he is still using this?  Go ahead and set up for a physical fasting

## 2019-08-05 NOTE — Telephone Encounter (Signed)
Sent patient a message on mychart asking him to inform us whether he is still taking [employer]

## 2019-08-07 NOTE — Telephone Encounter (Signed)
Patient stated he does not use this inhaler

## 2019-08-26 ENCOUNTER — Other Ambulatory Visit: Payer: Self-pay | Admitting: Medical

## 2019-08-26 DIAGNOSIS — J4540 Moderate persistent asthma, uncomplicated: Secondary | ICD-10-CM

## 2019-08-28 NOTE — Telephone Encounter (Signed)
Patient already has an appointment scheduled for a physical.

## 2019-08-28 NOTE — Telephone Encounter (Signed)
Schedule him for a fasting physical. Refill Advair and albuterol for 30 days if he is still having symptoms. He is certainly due for follow-up on asthma

## 2019-08-31 ENCOUNTER — Other Ambulatory Visit: Payer: Self-pay | Admitting: Medical

## 2019-08-31 DIAGNOSIS — J4540 Moderate persistent asthma, uncomplicated: Secondary | ICD-10-CM

## 2019-09-10 ENCOUNTER — Ambulatory Visit: Payer: 59 | Attending: Internal Medicine

## 2019-09-10 DIAGNOSIS — Z23 Encounter for immunization: Secondary | ICD-10-CM

## 2019-09-10 NOTE — Progress Notes (Signed)
   Covid-19 Vaccination Clinic  Name:  Seth Ferguson    MRN: [id number] DOB: 1965-07-01  09/10/2019  [name] was observed post Covid-19 immunization for 15 minutes without incident. He was provided with Vaccine Information Sheet and instruction to access the V-Safe system.   [name] was instructed to call 911 with any severe reactions post vaccine: Marland Kitchen Difficulty breathing  . Swelling of face and throat  . A fast heartbeat  . A bad rash all over body  . Dizziness and weakness   Immunizations Administered    Name Date Dose VIS Date Route   Pfizer COVID-19 Vaccine 09/10/2019  8:20 AM 0.3 mL 05/29/2019 Intramuscular   Manufacturer: [employer], [employer]   Lot: [id number]   NDC: [address]-0

## 2019-10-05 ENCOUNTER — Ambulatory Visit: Payer: 59 | Attending: Internal Medicine

## 2019-10-05 DIAGNOSIS — Z23 Encounter for immunization: Secondary | ICD-10-CM

## 2019-10-05 NOTE — Progress Notes (Signed)
   Covid-19 Vaccination Clinic  Name:  HAZEN BRUMETT    MRN: [id number] DOB: 1965/09/01  10/05/2019  Mr. Achille was observed post Covid-19 immunization for 15 minutes without incident. He was provided with Vaccine Information Sheet and instruction to access the V-Safe system.   Mr. Bellanca was instructed to call 911 with any severe reactions post vaccine: Marland Kitchen Difficulty breathing  . Swelling of face and throat  . A fast heartbeat  . A bad rash all over body  . Dizziness and weakness   Immunizations Administered    Name Date Dose VIS Date Route   Pfizer COVID-19 Vaccine 10/05/2019  8:11 AM 0.3 mL 08/12/2018 Intramuscular   Manufacturer: [employer], [employer]   Lot: [id number]   NDC: [address]-8

## 2019-10-08 ENCOUNTER — Encounter: Payer: Self-pay | Admitting: Medical

## 2019-10-08 ENCOUNTER — Ambulatory Visit: Payer: 59 | Admitting: Medical

## 2019-10-08 ENCOUNTER — Other Ambulatory Visit: Payer: Self-pay

## 2019-10-08 VITALS — BP 126/80 | HR 82 | Temp 97.6°F | Ht 72.0 in | Wt 200.2 lb

## 2019-10-08 DIAGNOSIS — J4540 Moderate persistent asthma, uncomplicated: Secondary | ICD-10-CM | POA: Diagnosis not present

## 2019-10-08 DIAGNOSIS — Z136 Encounter for screening for cardiovascular disorders: Secondary | ICD-10-CM | POA: Diagnosis not present

## 2019-10-08 DIAGNOSIS — Z0000 Encounter for general adult medical examination without abnormal findings: Secondary | ICD-10-CM | POA: Diagnosis not present

## 2019-10-08 DIAGNOSIS — Z862 Personal history of diseases of the blood and blood-forming organs and certain disorders involving the immune mechanism: Secondary | ICD-10-CM

## 2019-10-08 DIAGNOSIS — K449 Diaphragmatic hernia without obstruction or gangrene: Secondary | ICD-10-CM

## 2019-10-08 DIAGNOSIS — Z7189 Other specified counseling: Secondary | ICD-10-CM

## 2019-10-08 DIAGNOSIS — M549 Dorsalgia, unspecified: Secondary | ICD-10-CM

## 2019-10-08 DIAGNOSIS — M542 Cervicalgia: Secondary | ICD-10-CM

## 2019-10-08 DIAGNOSIS — Z125 Encounter for screening for malignant neoplasm of prostate: Secondary | ICD-10-CM

## 2019-10-08 DIAGNOSIS — Z7721 Contact with and (suspected) exposure to potentially hazardous body fluids: Secondary | ICD-10-CM

## 2019-10-08 DIAGNOSIS — Z7185 Encounter for immunization safety counseling: Secondary | ICD-10-CM | POA: Insufficient documentation

## 2019-10-08 DIAGNOSIS — J301 Allergic rhinitis due to pollen: Secondary | ICD-10-CM | POA: Diagnosis not present

## 2019-10-08 DIAGNOSIS — G8929 Other chronic pain: Secondary | ICD-10-CM

## 2019-10-08 MED ORDER — VENTOLIN HFA 108 (90 BASE) MCG/ACT IN AERS: INHALATION_SPRAY | RESPIRATORY_TRACT | 1 refills | Status: DC

## 2019-10-08 MED ORDER — MONTELUKAST SODIUM 10 MG PO TABS: ORAL_TABLET | ORAL | 3 refills | Status: DC

## 2019-10-08 NOTE — Patient Instructions (Signed)
Preventative Care for Adults - Male    Thank you for coming in for your well visit today, and thank you for trusting [place] with your care!   Maintain regular health and wellness exams:  A routine yearly physical is a good way to check in with your primary care provider about your health and preventive screening. It is also an opportunity to share updates about your health and any concerns you have, and receive a thorough all-over exam.   Most health insurance companies pay for at least some preventative services.  Check with your health plan for specific coverages.  What preventative services do men need?  Adult men should have their weight and blood pressure checked regularly.   Men age 85 and older should have their cholesterol levels checked regularly.  Beginning at age 31 and continuing to age 68, men should be screened for colorectal cancer.  Certain people may need continued testing until age 46.  Updating vaccinations is part of preventative care.  Vaccinations help protect against diseases such as the flu.  Osteoporosis is a disease in which the bones lose minerals and strength as we age. Men ages 91 and over should discuss this with their caregivers  Lab tests are generally done as part of preventative care to screen for anemia and blood disorders, to screen for problems with the kidneys and liver, to screen for bladder problems, to check blood sugar, and to check your cholesterol level.  Preventative services generally include counseling about diet, exercise, avoiding tobacco, drugs, excessive alcohol consumption, and sexually transmitted infections.   Xrays and CT scans are not normally done as a preventative test, and most insurances do not pay for imaging for screening other than as discussed under cancer screens below.   On the other hand, if you have certain medical concerns, imaging may be necessary as a diagnostic test.    Your Medical Team Your medical team starts with  [place], your PCP or primary care provider.  Please use our services for your routine care such as physicals, screenings, immunizations, sick visits, and your first stop for general medical concerns.  You can call our number for after hours information for urgent questions that may need attention but cannot wait til the next business day.    Urgent care-urgent cares exist to provide care when your primary care office would typically be closed such as evenings or weekends.   Urgent care is for evaluation of urgent medical problems that do not necessarily require emergency department care, but cannot wait til the next business day when we are open.  Emergency department care-please reserve emergency department care for serious, urgent, possibly life-threatening medical problems.  This includes issues like possible stroke, heart attack, significant injury, mental health crisis, or other urgent need that requires immediate medical attention.     See your dentist office twice yearly for hygiene and cleaning visits.   Brush your teeth and floss your teeth daily.  See your eye doctor yearly for routine eye exam and screenings for glaucoma and retinal disease.   Vaccines:  Stay up to date with your tetanus shots and other required immunizations. You should have a booster for tetanus every 10 years. Be sure to get your flu shot every year, since 5%-20% of the [place] population comes down with the flu. The flu vaccine changes each year, so being vaccinated once is not enough. Get your shot in the fall, before the flu season peaks.   Other vaccines to consider:  Pneumococcal vaccine to protect against certain types of pneumonia.  This is normally recommended for adults age 71 or older.  However, adults younger than 54 years old with certain underlying conditions such as diabetes, heart or lung disease should also receive the vaccine.  Shingles vaccine to protect against Varicella Zoster if you are older than age 46,  or younger than 54 years old with certain underlying illness.  If you have not had the Shingrix vaccine, please call your insurer to inquire about coverage for the Shingrix vaccine given in 2 doses.   Some insurers cover this vaccine after age 19, some cover this after age 42.  If your insurer covers this, then call to schedule appointment to have this vaccine here  Hepatitis A vaccine to protect against a form of infection of the liver by a virus acquired from food.  Hepatitis B vaccine to protect against a form of infection of the liver by a virus acquired from blood or body fluids, particularly if you work in health care.  If you plan to travel internationally, check with your local health department for specific vaccination recommendations.  Human Papilloma Virus or HPV causes cancer of the cervix, and other infections that can be transmitted from person to person. There is a vaccine for HPV, and males should get immunized between the ages of 52 and 29. It requires a series of 3 shots.   Covid/Coronavirus - as the vaccines are becoming available, please consider vaccination if you are a health care worker, first responder, or have significant health problems such as asthma, COPD, heart disease, hypertension, diabetes, obesity, multiple medical problems, over age 66yo, or immunocompromised.    You are up to date on Covid vaccine.  I recommend the following vaccines: You are due for the following vaccines, Tdap, tetanus diphtheria pertussis vaccine every 10 years, yearly flu shot, Shingrix shingles vaccine  Shingles vaccine:  I recommend you have a shingles vaccine to help prevent shingles or herpes zoster outbreak.   Please call your insurer to inquire about coverage for the Shingrix vaccine given in 2 doses.   Some insurers cover this vaccine after age 24, some cover this after age 80.  If your insurer covers this, then call to schedule appointment to have this vaccine here.  Let us plan to  update either the Tdap or shingles vaccine about a month from now since you just finished a second Covid vaccine  You are immune to hepatitis B per 2017 lab.  I recommend the Hepatitis A vaccine, 2 shots 6 months apart   What should I know about Cancer screening? Many types of cancers can be detected early and may often be prevented. Lung Cancer  You should be screened every year for lung cancer if: ? You are a current smoker who has smoked for at least 30 years. ? You are a former smoker who has quit within the past 15 years.  Talk to your health care provider about your screening options, when you should start screening, and how often you should be screened.  Colorectal Cancer  Routine colorectal cancer screening usually begins at 54 years of age and should be repeated every 5-10 years until you are 54 years old. You may need to be screened more often if early forms of precancerous polyps or small growths are found. Your health care provider may recommend screening at an earlier age if you have risk factors for colon cancer.  Your health care provider may recommend using  home test kits to check for hidden blood in the stool.  A small camera at the end of a tube can be used to examine your colon (sigmoidoscopy or colonoscopy). This checks for the earliest forms of colorectal cancer.  Prostate and Testicular Cancer  Depending on your age and overall health, your health care provider may do certain tests to screen for prostate and testicular cancer.  Talk to your health care provider about any symptoms or concerns you have about testicular or prostate cancer.  Skin Cancer  Check your skin from head to toe regularly.  Tell your health care provider about any new moles or changes in moles, especially if: ? There is a change in a mole's size, shape, or color. ? You have a mole that is larger than a pencil eraser.  Always use sunscreen. Apply sunscreen liberally and repeat  throughout the day.  Protect yourself by wearing long sleeves, pants, a wide-brimmed hat, and sunglasses when outside.   Are you up to date on cancer screenings:  COLON CANCER SCREENING: You are up-to-date  PROSTATE CANCER SCREENING:.  PSA today      GENERAL RECOMMENDATIONS FOR GOOD HEALTH:  Healthy diet:  Eat a variety of foods, including fruit, vegetables, animal or vegetable protein, such as meat, fish, chicken, and eggs, or beans, lentils, tofu, and grains, such as rice.  Drink plenty of water daily.  Decrease saturated fat in the diet, avoid lots of red meat, processed foods, sweets, fast foods, and fried foods.  Exercise:  Aerobic exercise helps maintain good heart health. At least 30-40 minutes of moderate-intensity exercise is recommended. For example, a brisk walk that increases your heart rate and breathing. This should be done on most days of the week.   Find a type of exercise or a variety of exercises that you enjoy so that it becomes a part of your daily life.  Examples are running, walking, swimming, water aerobics, and biking.  For motivation and support, explore group exercise such as aerobic class, spin class, Zumba, Yoga,or  martial arts, etc.    Set exercise goals for yourself, such as a certain weight goal, walk or run in a race such as a 5k walk/run.  Speak to your primary care provider about exercise goals.  Your weight readings per our records: Wt Readings from Last 3 Encounters:  10/08/19 200 lb 3.2 oz (90.8 kg)  10/15/16 202 lb (91.6 kg)  04/02/16 198 lb (89.8 kg)    Body mass index is 27.15 kg/m.    Disease prevention:  If you smoke or chew tobacco, find out from your caregiver how to quit. It can literally save your life, no matter how long you have been a tobacco user. If you do not use tobacco, never begin.   Maintain a healthy diet and normal weight. Increased weight leads to problems with blood pressure and diabetes.   The Body Mass Index  or BMI is a way of measuring how much of your body is fat. Having a BMI above 27 increases the risk of heart disease, diabetes, hypertension, stroke and other problems related to obesity. Your caregiver can help determine your BMI and based on it develop an exercise and dietary program to help you achieve or maintain this important measurement at a healthful level.  High blood pressure causes heart and blood vessel problems.  Persistent high blood pressure should be treated with medicine if weight loss and exercise do not work.  Your blood pressure readings per  our records:     BP Readings from Last 3 Encounters:  10/08/19 126/80  10/15/16 120/78  04/02/16 (!) 128/95     Fat and cholesterol leaves deposits in your arteries that can block them. This causes heart disease and vessel disease elsewhere in your body.  If your cholesterol is found to be high, or if you have heart disease or certain other medical conditions, then you may need to have your cholesterol monitored frequently and be treated with medication.   Ask if you should have a cardiac stress test if your history suggests this. A stress test is a test done on a treadmill that looks for heart disease. This test can find disease prior to there being a problem.    Heart disease screening:  EKG today   Osteoporosis is a disease in which the bones lose minerals and strength as we age. This can result in serious bone fractures. Risk of osteoporosis can be identified using a bone density scan. Men ages 64 and over should discuss this with their caregivers. Ask your caregiver whether you should be taking a calcium supplement and Vitamin D, to reduce the rate of osteoporosis.   Avoid drinking alcohol in excess (more than two drinks per day).  Avoid use of street drugs. Do not share needles with anyone. Ask for professional help if you need assistance or instructions on stopping the use of alcohol, cigarettes, and/or drugs.  Brush your teeth  twice a day with fluoride toothpaste, and floss once a day. Good oral hygiene prevents tooth decay and gum disease. The problems can be painful, unattractive, and can cause other health problems. Visit your dentist for a routine oral and dental check up and preventive care every 6-12 months.   Safety:  Use seatbelts 100% of the time, whether driving or as a passenger.  Use safety devices such as hearing protection if you work in environments with loud noise or significant background noise.  Use safety glasses when doing any work that could send debris in to the eyes.  Use a helmet if you ride a bike or motorcycle.  Use appropriate safety gear for contact sports.  Talk to your caregiver about gun safety.  Use sunscreen with a SPF (or skin protection factor) of 15 or greater.  Lighter skinned people are at a greater risk of skin cancer. Don't forget to also wear sunglasses in order to protect your eyes from too much damaging sunlight. Damaging sunlight can accelerate cataract formation.   Keep carbon monoxide and smoke detectors in your home functioning at all times. Change the batteries every 6 months or use a model that plugs into the wall.    Sexual activity: . Sex is a normal part of life and sexual activity can continue into older adulthood for many healthy people.   . If you are having erectile dysfunction issues, please follow up to discuss this further.   . If you are not in a monogamous relationship or have more than one partner, please practice safe sex.  Use condoms. Condoms are used for birth control and to help reduce the spread of sexually transmitted infections (or STIs).  Some of the STIs are gonorrhea (the clap), chlamydia, syphilis, trichomonas, herpes, HPV (human papilloma virus) and HIV (human immunodeficiency virus) which causes AIDS. The herpes, HIV and HPV are viral illnesses that have no cure. These can result in disability, cancer and death.   We are able to test for STIs here at  our office.

## 2019-10-08 NOTE — Progress Notes (Signed)
Subjective:   HPI  Seth Ferguson is a 54 y.o. male who presents for Chief Complaint  Patient presents with  . Annual Exam    with fasting labs     Patient Care Team: Kemba Hoppes, Camelia Eng, PA-C as PCP - General (Family Medicine) Sees dentist Sees eye doctor  Concerns: Asthma - lately hasn't been using Advair.   Due to donating plasma, he has not been using Singulair.   Uses albuterol prn, on average once monthly.  Reviewed their medical, surgical, family, social, medication, and allergy history and updated chart as appropriate.  Past Medical History:  Diagnosis Date  . Allergy   . Arthritis    back  . Asthma   . Chronic back pain    self reported 01/2016  . Hiatal hernia   . Neck pain 01/2016   self reported  . Sarcoidosis 1998   ?  Marland Kitchen Wears glasses     Past Surgical History:  Procedure Laterality Date  . CIRCUMCISION    . COLONOSCOPY  2017   Dr. Zenovia Jarred    Social History   Socioeconomic History  . Marital status: Married    Spouse name: Not on file  . Number of children: Not on file  . Years of education: Not on file  . Highest education level: Not on file  Occupational History  . Not on file  Tobacco Use  . Smoking status: Former Research scientist (life sciences)  . Smokeless tobacco: Never Used  . Tobacco comment: quit 20 yrs ago  Substance and Sexual Activity  . Alcohol use: Yes    Comment: occassionally  . Drug use: No  . Sexual activity: Not on file  Other Topics Concern  . Not on file  Social History Narrative   Originally from [place], married, has 4 children, 2 boys , exercise  Active on the job, uses stairs, cuts grass, yard work.   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 Difficulty of Paying Living Expenses:   Food Insecurity:   . Worried About Charity fundraiser in the Last Year:   . Arboriculturist in the Last Year:   Transportation Needs:   . Lexicographer (Medical):   Marland Kitchen Lack of Transportation (Non-Medical):   Physical Activity:   . Days of Exercise per Week:   . Minutes of Exercise per Session:   Stress:   . Feeling of Stress :   Social Connections:   . Frequency of Communication with Friends and Family:   . Frequency of Social Gatherings with Friends and Family:   . Attends Religious Services:   . Active Member of Clubs or Organizations:   . Attends Archivist Meetings:   Marland Kitchen Marital Status:   Intimate Partner Violence:   . Fear of Current or Ex-Partner:   . Emotionally Abused:   Marland Kitchen Physically Abused:   . Sexually Abused:     Family History  Problem Relation Age of Onset  . Asthma Mother   . Diabetes Mother   . Other Mother        died of "broken heart" per son  . Stroke Mother   . Seizures Mother   . Other Father        lung disease?  Marland Kitchen Pneumonia Father   . Asthma Sister   . Diabetes Sister   . Asthma Brother   . Diabetes Brother   .  Diabetes Sister   . Heart disease Neg Hx   . Hypertension Neg Hx   . Cancer Neg Hx   . Colon cancer Neg Hx   . Colon polyps Neg Hx   . Esophageal cancer Neg Hx   . Rectal cancer Neg Hx   . Stomach cancer Neg Hx      Current Outpatient Medications:  Marland Kitchen  VENTOLIN HFA 108 (90 Base) MCG/ACT inhaler, INHALE 2 PUFFS INTO THE LUNGS EVERY 6 HOURS AS NEEDED FOR WHEEZING OR SHORTNESS OF BREATH, Disp: 18 g, Rfl: 1 .  montelukast (SINGULAIR) 10 MG tablet, TAKE 1 TABLET(10 MG) BY MOUTH AT BEDTIME, Disp: 90 tablet, Rfl: 3 .  Pediatric Multivit-Minerals-C (KIDS GUMMY BEAR VITAMINS PO), Take 1 Units by mouth daily., Disp: , Rfl:   Current Facility-Administered Medications:  .  0.9 %  sodium chloride infusion, 500 mL, Intravenous, Continuous, Pyrtle, Carie Caddy, MD  No Known Allergies   Review of Systems Constitutional: -fever, -chills, -sweats, -unexpected weight change, -decreased appetite, -fatigue Allergy: -sneezing, -itching, -congestion Dermatology: -changing moles,  --rash, -lumps ENT: -runny nose, -ear pain, -sore throat, -hoarseness, -sinus pain, -teeth pain, - ringing in ears, -hearing loss, -nosebleeds Cardiology: -chest pain, -palpitations, -swelling, -difficulty breathing when lying flat, -waking up short of breath Respiratory: -cough, -shortness of breath, -difficulty breathing with exercise or exertion, -wheezing, -coughing up blood Gastroenterology: -abdominal pain, -nausea, -vomiting, -diarrhea, -constipation, -blood in stool, -changes in bowel movement, -difficulty swallowing or eating Hematology: -bleeding, -bruising  Musculoskeletal: -joint aches, -muscle aches, -joint swelling, -back pain, -neck pain, -cramping, -changes in gait Ophthalmology: denies vision changes, eye redness, itching, discharge Urology: -burning with urination, -difficulty urinating, -blood in urine, -urinary frequency, -urgency, -incontinence Neurology: -headache, -weakness, -tingling, -numbness, -memory loss, -falls, -dizziness Psychology: -depressed mood, -agitation, -sleep problems Male GU: no testicular mass, pain, no lymph nodes swollen, no swelling, no rash.     Objective:  BP 126/80   Pulse 82   Temp 97.6 F (36.4 C)   Ht 6' (1.829 m)   Wt 200 lb 3.2 oz (90.8 kg)   SpO2 99%   BMI 27.15 kg/m   Wt Readings from Last 3 Encounters:  10/08/19 200 lb 3.2 oz (90.8 kg)  10/15/16 202 lb (91.6 kg)  04/02/16 198 lb (89.8 kg)   General appearance: alert, no distress, WD/WN, African American male Skin: no worrisome lesions Neck: supple, no lymphadenopathy, no thyromegaly, no masses, normal ROM, no bruits Chest: non tender, normal shape and expansion Heart: RRR, normal S1, S2, no murmurs Lungs: CTA bilaterally, no wheezes, rhonchi, or rales Abdomen: +bs, soft, non tender, non distended, no masses, no hepatomegaly, no splenomegaly, no bruits Back: non tender, normal ROM, no scoliosis Musculoskeletal: upper extremities non tender, no obvious deformity, normal ROM  throughout, lower extremities non tender, no obvious deformity, normal ROM throughout Extremities: no edema, no cyanosis, no clubbing Pulses: 2+ symmetric, upper and lower extremities, normal cap refill Neurological: alert, oriented x 3, CN2-12 intact, strength normal upper extremities and lower extremities, sensation normal throughout, DTRs 2+ throughout, no cerebellar signs, gait normal Psychiatric: normal affect, behavior normal, pleasant  GU: normal male external genitalia,circumcised, nontender, no masses, no hernia, no lymphadenopathy Rectal: anus normal tone, prostate WNL   EKG Indication screen heart disease and physical, rate 65 bpm, PR 178 ms, QRS 96 ms, QTC 399 ms, Axis 70 degrees.  Normal sinus rhythm.  No acute change    Assessment and Plan :   Encounter Diagnoses  Name Primary?  Marland Kitchen  Encounter for health maintenance examination in adult Yes  . Moderate persistent chronic asthma without complication   . Allergic rhinitis due to pollen, unspecified seasonality   . Screening for prostate cancer   . History of sarcoidosis   . Hiatal hernia   . Contact with potentially hazardous body fluids   . Chronic back pain, unspecified back location, unspecified back pain laterality   . Chronic neck pain   . Vaccine counseling   . Screening for heart disease     Physical exam - discussed and counseled on healthy lifestyle, diet, exercise, preventative care, vaccinations, sick and well care, proper use of emergency dept and after hours care, and addressed their concerns.    Health screening: See your eye doctor yearly for routine vision care. See your dentist yearly for routine dental care including hygiene visits twice yearly.  Cancer screening  Colonoscopy:  Reviewed colonoscopy on file that is up to date  Discussed PSA, prostate exam, and prostate cancer screening risks/benefits.     Vaccinations: Advised yearly influenza vaccine He gets flu shot yearly at work  He just  finished Covid vaccine  Return in 1 month for Tdap, 6-8 weeks for Shingrix #1   Acute issues discussed: none  Separate significant chronic issues discussed: Asthma and allergies - restart singulair, c/t albuterol prn.  Discussed severity of asthma, medications for prevention Stop Adavir due to mild severity currently.   If needed can go on inhaler steroid only if symptoms worsen.     Eliza was seen today for annual exam.  Diagnoses and all orders for this visit:  Encounter for health maintenance examination in adult -     Comprehensive metabolic panel -     CBC with Differential/Platelet -     Lipid panel -     PSA -     EKG 12-Lead  Moderate persistent chronic asthma without complication -     montelukast (SINGULAIR) 10 MG tablet; TAKE 1 TABLET(10 MG) BY MOUTH AT BEDTIME -     VENTOLIN HFA 108 (90 Base) MCG/ACT inhaler; INHALE 2 PUFFS INTO THE LUNGS EVERY 6 HOURS AS NEEDED FOR WHEEZING OR SHORTNESS OF BREATH  Allergic rhinitis due to pollen, unspecified seasonality  Screening for prostate cancer -     PSA  History of sarcoidosis  Hiatal hernia  Contact with potentially hazardous body fluids  Chronic back pain, unspecified back location, unspecified back pain laterality  Chronic neck pain  Vaccine counseling  Screening for heart disease -     EKG 12-Lead    Follow-up pending labs, yearly for physical

## 2019-10-09 ENCOUNTER — Other Ambulatory Visit: Payer: Self-pay | Admitting: Medical

## 2019-10-09 DIAGNOSIS — R7989 Other specified abnormal findings of blood chemistry: Secondary | ICD-10-CM

## 2019-10-09 LAB — LIPID PANEL
Chol/HDL Ratio: 3 ratio (ref 0.0–5.0)
Cholesterol, Total: 154 mg/dL (ref 100–199)
HDL: 52 mg/dL (ref >39)
LDL Chol Calc (NIH): 88 mg/dL (ref 0–99)
Triglycerides: 71 mg/dL (ref 0–149)
VLDL Cholesterol Cal: 14 mg/dL (ref 5–40)

## 2019-10-09 LAB — COMPREHENSIVE METABOLIC PANEL
ALT: 101 IU/L — ABNORMAL HIGH (ref 0–44)
AST: 68 IU/L — ABNORMAL HIGH (ref 0–40)
Albumin/Globulin Ratio: 1.6 (ref 1.2–2.2)
Albumin: 3.8 g/dL (ref 3.8–4.9)
Alkaline Phosphatase: 59 IU/L (ref 39–117)
BUN/Creatinine Ratio: 9 (ref 9–20)
BUN: 10 mg/dL (ref 6–24)
Bilirubin Total: 0.3 mg/dL (ref 0.0–1.2)
CO2: 25 mmol/L (ref 20–29)
Calcium: 8.7 mg/dL (ref 8.7–10.2)
Chloride: 99 mmol/L (ref 96–106)
Creatinine, Ser: 1.07 mg/dL (ref 0.76–1.27)
GFR calc Af Amer: 91 mL/min/{1.73_m2} (ref >59)
GFR calc non Af Amer: 79 mL/min/{1.73_m2} (ref >59)
Globulin, Total: 2.4 g/dL (ref 1.5–4.5)
Glucose: 105 mg/dL — ABNORMAL HIGH (ref 65–99)
Potassium: 4.5 mmol/L (ref 3.5–5.2)
Sodium: 135 mmol/L (ref 134–144)
Total Protein: 6.2 g/dL (ref 6.0–8.5)

## 2019-10-09 LAB — CBC WITH DIFFERENTIAL/PLATELET
Basophils Absolute: 0 10*3/uL (ref 0.0–0.2)
Basos: 0 %
EOS (ABSOLUTE): 0.2 10*3/uL (ref 0.0–0.4)
Eos: 5 %
Hematocrit: 48.6 % (ref 37.5–51.0)
Hemoglobin: 16.7 g/dL (ref 13.0–17.7)
Immature Grans (Abs): 0 10*3/uL (ref 0.0–0.1)
Immature Granulocytes: 0 %
Lymphocytes Absolute: 2.3 10*3/uL (ref 0.7–3.1)
Lymphs: 50 %
MCH: 30.6 pg (ref 26.6–33.0)
MCHC: 34.4 g/dL (ref 31.5–35.7)
MCV: 89 fL (ref 79–97)
Monocytes Absolute: 0.6 10*3/uL (ref 0.1–0.9)
Monocytes: 13 %
Neutrophils Absolute: 1.5 10*3/uL (ref 1.4–7.0)
Neutrophils: 32 %
Platelets: 214 10*3/uL (ref 150–450)
RBC: 5.45 x10E6/uL (ref 4.14–5.80)
RDW: 12.7 % (ref 11.6–15.4)
WBC: 4.7 10*3/uL (ref 3.4–10.8)

## 2019-10-09 LAB — IRON: Iron: 42 ug/dL (ref 38–169)

## 2019-10-09 LAB — SPECIMEN STATUS REPORT

## 2019-10-09 LAB — PSA: Prostate Specific Ag, Serum: 0.3 ng/mL (ref 0.0–4.0)

## 2019-10-20 ENCOUNTER — Ambulatory Visit
Admission: RE | Admit: 2019-10-20 | Discharge: 2019-10-20 | Disposition: A | Discharge status: Home / Self Care | Payer: 59 | Source: Ambulatory Visit | Attending: Medical | Admitting: Medical

## 2019-10-20 DIAGNOSIS — R7989 Other specified abnormal findings of blood chemistry: Secondary | ICD-10-CM

## 2019-10-21 ENCOUNTER — Other Ambulatory Visit: Payer: Self-pay | Admitting: Medical

## 2019-10-21 DIAGNOSIS — R7989 Other specified abnormal findings of blood chemistry: Secondary | ICD-10-CM

## 2019-11-09 ENCOUNTER — Other Ambulatory Visit: Payer: 59

## 2019-11-11 ENCOUNTER — Ambulatory Visit: Payer: 59 | Admitting: Gastroenterology

## 2019-11-11 ENCOUNTER — Other Ambulatory Visit: Payer: Self-pay

## 2019-11-11 ENCOUNTER — Other Ambulatory Visit (INDEPENDENT_AMBULATORY_CARE_PROVIDER_SITE_OTHER): Payer: 59

## 2019-11-11 ENCOUNTER — Encounter: Payer: Self-pay | Admitting: Gastroenterology

## 2019-11-11 ENCOUNTER — Other Ambulatory Visit: Payer: Self-pay | Admitting: Medical

## 2019-11-11 ENCOUNTER — Telehealth: Payer: Self-pay | Admitting: Internal Medicine

## 2019-11-11 VITALS — BP 110/70 | HR 78 | Ht 72.0 in | Wt 202.5 lb

## 2019-11-11 DIAGNOSIS — R7989 Other specified abnormal findings of blood chemistry: Secondary | ICD-10-CM | POA: Diagnosis not present

## 2019-11-11 LAB — HEPATIC FUNCTION PANEL
ALT: 25 U/L (ref 0–53)
AST: 23 U/L (ref 0–37)
Albumin: 3.7 g/dL (ref 3.5–5.2)
Alkaline Phosphatase: 36 U/L — ABNORMAL LOW (ref 39–117)
Bilirubin, Direct: 0.1 mg/dL (ref 0.0–0.3)
Total Bilirubin: 0.5 mg/dL (ref 0.2–1.2)
Total Protein: 6.3 g/dL (ref 6.0–8.3)

## 2019-11-11 LAB — PROTIME-INR
INR: 0.9 ratio (ref 0.8–1.0)
Prothrombin Time: 10.1 s (ref 9.6–13.1)

## 2019-11-11 LAB — IBC + FERRITIN
Ferritin: 49.5 ng/mL (ref 22.0–322.0)
Iron: 102 ug/dL (ref 42–165)
Saturation Ratios: 23.5 % (ref 20.0–50.0)
Transferrin: 310 mg/dL (ref 212.0–360.0)

## 2019-11-11 MED ORDER — ALBUTEROL SULFATE HFA 108 (90 BASE) MCG/ACT IN AERS: 2.0000 | INHALATION_SPRAY | Freq: Four times a day (QID) | RESPIRATORY_TRACT | 0 refills | Status: DC | PRN

## 2019-11-11 NOTE — Telephone Encounter (Signed)
Pt came by and states Ventolin HFA is not covered by insurance and needs to be changed to generic Proair or Proventil HFA per insurance. Send to [facility] [address] Please advise pt of this

## 2019-11-11 NOTE — Patient Instructions (Addendum)
If you are age 54 or older, your body mass index should be between 23-30. Your Body mass index is 27.46 kg/m. If this is out of the aforementioned range listed, please consider follow up with your Primary Care Provider.  If you are age 69 or younger, your body mass index should be between 19-25. Your Body mass index is 27.46 kg/m. If this is out of the aformentioned range listed, please consider follow up with your Primary Care Provider.   Your provider has requested that you go to the basement level for lab work before leaving today. Press "B" on the elevator. The lab is located at the first door on the left as you exit the elevator.

## 2019-11-11 NOTE — Progress Notes (Addendum)
     11/11/2019 Seth Ferguson [id number] February 18, 1966   HISTORY OF PRESENT ILLNESS:  This is a pleasant 54 year old male who is a patient of Dr. Lauro Franklin known to him only for colonoscopy in 2017.  He is here today at the request of his PCP, Crosby Oyster, PA-C, for evaluation of elevated LFTs.  Last month AST was 68 and ALT was 101 with normal ALP and total bili.  Never elevated in the past.  Ultrasound of the liver was normal.  Rare ETOH use.  No meds, supplements, etc.  No family history of liver disease to his knowledge.  Feels good.  No complaints.  Hep B and C negative in 2017.  CBC normal.   Past Medical History:  Diagnosis Date  . Allergy   . Arthritis    back  . Asthma   . Chronic back pain    self reported 01/2016  . Hiatal hernia   . Neck pain 01/2016   self reported  . Sarcoidosis 1998   ?  Marland Kitchen Wears glasses    Past Surgical History:  Procedure Laterality Date  . CIRCUMCISION    . COLONOSCOPY  2017   Dr. Erick Blinks    reports that he has quit smoking. He has never used smokeless tobacco. He reports current alcohol use. He reports that he does not use drugs. family history includes Asthma in his brother, mother, and sister; Diabetes in his brother, mother, sister, and sister; Other in his father and mother; Pneumonia in his father; Seizures in his mother; Stroke in his mother. No Known Allergies    Outpatient Encounter Medications as of 11/11/2019  Medication Sig  . montelukast (SINGULAIR) 10 MG tablet TAKE 1 TABLET(10 MG) BY MOUTH AT BEDTIME  . VENTOLIN HFA 108 (90 Base) MCG/ACT inhaler INHALE 2 PUFFS INTO THE LUNGS EVERY 6 HOURS AS NEEDED FOR WHEEZING OR SHORTNESS OF BREATH  . [DISCONTINUED] Pediatric Multivit-Minerals-C (KIDS GUMMY BEAR VITAMINS PO) Take 1 Units by mouth daily.  . [DISCONTINUED] 0.9 %  sodium chloride infusion    No facility-administered encounter medications on file as of 11/11/2019.     REVIEW OF SYSTEMS  : All other systems reviewed and  negative except where noted in the History of Present Illness.   PHYSICAL EXAM: BP 110/70 (BP Location: Left Arm, Patient Position: Sitting, Cuff Size: Normal)   Pulse 78   Ht 6' (1.829 m)   Wt 202 lb 8 oz (91.9 kg)   SpO2 97%   BMI 27.46 kg/m  General: Well developed AA male in no acute distress Head: Normocephalic and atraumatic Eyes:  Sclerae anicteric, conjunctiva pink. Ears: Normal auditory acuity Lungs: Clear throughout to auscultation; no increased WOB. Heart: Regular rate and rhythm; no M/R/G. Abdomen: Soft, non-distended.  BS present.  Non-tender. Musculoskeletal: Symmetrical with no gross deformities  Skin: No lesions on visible extremities Extremities: No edema  Neurological: Alert oriented x 4, grossly non-focal Psychological:  Alert and cooperative. Normal mood and affect  ASSESSMENT AND PLAN: *Elevated LFTs:  One time elevation with AST 68 and ALT 101.  Rare ETOH use.  No meds, supplements, etc.  Ultrasound normal.  Will recheck LFT's today.  Will check viral hepatitis studies, iron studies and PT/INR to start.  Will see if LFT's remain elevated on repeat before proceeding with extensive evaluation.     CC:  Tysinger, Kermit Balo, PA-C

## 2019-11-12 LAB — HEPATITIS B SURFACE ANTIGEN: Hepatitis B Surface Ag: NONREACTIVE

## 2019-11-12 LAB — HEPATITIS C ANTIBODY
Hepatitis C Ab: NONREACTIVE
SIGNAL TO CUT-OFF: 0.02 (ref <1.00)

## 2019-11-12 LAB — HEPATITIS A ANTIBODY, TOTAL: Hepatitis A AB,Total: NONREACTIVE

## 2019-11-12 LAB — HEPATITIS B SURFACE ANTIBODY,QUALITATIVE: Hep B S Ab: REACTIVE — AB

## 2019-11-24 NOTE — Progress Notes (Signed)
Addendum: Reviewed and agree with assessment and management plan. Kirbi Farrugia M, MD  

## 2019-12-02 ENCOUNTER — Other Ambulatory Visit: Payer: 59

## 2019-12-08 ENCOUNTER — Other Ambulatory Visit (INDEPENDENT_AMBULATORY_CARE_PROVIDER_SITE_OTHER): Payer: 59

## 2019-12-08 ENCOUNTER — Other Ambulatory Visit: Payer: Self-pay

## 2019-12-08 DIAGNOSIS — Z23 Encounter for immunization: Secondary | ICD-10-CM

## 2020-01-05 ENCOUNTER — Other Ambulatory Visit: Payer: Self-pay | Admitting: Medical

## 2020-02-08 ENCOUNTER — Telehealth: Payer: Self-pay | Admitting: Internal Medicine

## 2020-02-08 ENCOUNTER — Other Ambulatory Visit: Payer: Self-pay

## 2020-02-08 NOTE — Telephone Encounter (Signed)
Ask when he comes in, but this may be a recheck on (hepatic function panel) given the last time he saw GI they mentioned him doing periodic liver test here.  If not let me know and we will figure out some other plan

## 2020-02-08 NOTE — Telephone Encounter (Signed)
Pt is on the nurse schedule today but not sure why. Please advise

## 2020-02-08 NOTE — Telephone Encounter (Signed)
Will advise when pt comes in

## 2020-02-09 ENCOUNTER — Other Ambulatory Visit (INDEPENDENT_AMBULATORY_CARE_PROVIDER_SITE_OTHER): Payer: 59

## 2020-02-09 ENCOUNTER — Other Ambulatory Visit: Payer: Self-pay

## 2020-02-09 DIAGNOSIS — Z23 Encounter for immunization: Secondary | ICD-10-CM | POA: Diagnosis not present

## 2021-07-06 ENCOUNTER — Ambulatory Visit (HOSPITAL_COMMUNITY)
Admission: EM | Admit: 2021-07-06 | Discharge: 2021-07-06 | Disposition: A | Discharge status: Home / Self Care | Payer: 59 | Attending: Internal Medicine | Admitting: Internal Medicine

## 2021-07-06 ENCOUNTER — Encounter (HOSPITAL_COMMUNITY): Payer: Self-pay

## 2021-07-06 ENCOUNTER — Other Ambulatory Visit: Payer: Self-pay

## 2021-07-06 DIAGNOSIS — R0789 Other chest pain: Secondary | ICD-10-CM | POA: Diagnosis not present

## 2021-07-06 NOTE — Discharge Instructions (Signed)
Please follow up with Seth Ferguson as soon as possible.   If you develop any concerning chest pain symptoms, please go to the ER.

## 2021-07-06 NOTE — ED Triage Notes (Signed)
Pt c/o rt upper chest pain and rt jaw pain this morning off and on for 1.5hrs. states this is the 3rd episode in past few weeks.

## 2021-07-06 NOTE — ED Provider Notes (Signed)
[facility]    CSN: [id number] Arrival date & time: 07/06/21  2482      History   Chief Complaint Chief Complaint  Patient presents with   Chest Pain    HPI Seth Ferguson is a 56 y.o. male.  Patient reports right chest pain, right arm pain and tingling in his right jaw this morning.  No symptoms at this time.  Patient works night shift, finished work at 7 AM after working 18 hours.  His job involves significant walking.  No symptoms while at work.  When he got home, he was helping his grandson fall asleep for a nap, and when he laid down with his grandson, he developed the symptoms.  He sat up and symptoms were somewhat improved, drink some water and then laid back down again causing symptoms to resume.  Does not have a family history of heart disease or early heart disease.  History of hypertension    Chest Pain Associated symptoms: no diaphoresis, no dizziness, no fever, no nausea, no palpitations, no shortness of breath and no vomiting    Past Medical History:  Diagnosis Date   Allergy    Arthritis    back   Asthma    Chronic back pain    self reported 01/2016   Hiatal hernia    Neck pain 01/2016   self reported   Sarcoidosis 1998   ?   Wears glasses     Patient Active Problem List   Diagnosis Date Noted   Elevated LFTs 11/11/2019   Vaccine counseling 10/08/2019   Screening for heart disease 10/08/2019   Encounter for health maintenance examination in adult 01/18/2016   History of sarcoidosis 01/18/2016   Asthma, chronic 01/18/2016   Rhinitis, allergic 01/18/2016   Screening for prostate cancer 01/18/2016   Hiatal hernia 01/18/2016   Chronic neck pain 01/18/2016   Chronic back pain 01/18/2016   Contact with potentially hazardous body fluids 01/18/2016    Past Surgical History:  Procedure Laterality Date   CIRCUMCISION     COLONOSCOPY  2017   Dr. Erick Blinks       Home Medications    Prior to Admission medications   Medication Sig  Start Date End Date Taking? Authorizing Provider  albuterol (VENTOLIN HFA) 108 (90 Base) MCG/ACT inhaler INHALE 2 PUFFS INTO THE LUNGS EVERY 6 HOURS AS NEEDED FOR WHEEZING OR SHORTNESS OF BREATH 01/05/20   Ferguson, Kermit Balo, PA-C  montelukast (SINGULAIR) 10 MG tablet TAKE 1 TABLET(10 MG) BY MOUTH AT BEDTIME 10/08/19   Ferguson, Kermit Balo, PA-C    Family History Family History  Problem Relation Age of Onset   Asthma Mother    Diabetes Mother    Other Mother        died of "broken heart" per son   Stroke Mother    Seizures Mother    Other Father        lung disease?   Pneumonia Father    Asthma Sister    Diabetes Sister    Asthma Brother    Diabetes Brother    Diabetes Sister    Heart disease Neg Hx    Hypertension Neg Hx    Cancer Neg Hx    Colon cancer Neg Hx    Colon polyps Neg Hx    Esophageal cancer Neg Hx    Rectal cancer Neg Hx    Stomach cancer Neg Hx     Social History Social History   Tobacco Use  Smoking status: Former   Smokeless tobacco: Never   Tobacco comments:    quit 20 yrs ago  Vaping Use   Vaping Use: Never used  Substance Use Topics   Alcohol use: Yes    Comment: 12 beers per week    Drug use: No     Allergies   Patient has no known allergies.   Review of Systems Review of Systems  Constitutional:  Negative for chills, diaphoresis and fever.  Respiratory:  Positive for chest tightness. Negative for shortness of breath.   Cardiovascular:  Positive for chest pain. Negative for palpitations and leg swelling.  Gastrointestinal:  Negative for nausea and vomiting.  Neurological:  Negative for dizziness.    Physical Exam Triage Vital Signs ED Triage Vitals  Enc Vitals Group     BP 07/06/21 1049 (!) 147/75     Pulse Rate 07/06/21 1046 82     Resp 07/06/21 1046 18     Temp 07/06/21 1046 98.5 F (36.9 C)     Temp src --      SpO2 07/06/21 1046 99 %     Weight --      Height --      Head Circumference --      Peak Flow --      Pain  Score 07/06/21 1047 0     Pain Loc --      Pain Edu? --      Excl. in GC? --    No data found.  Updated Vital Signs BP (!) 147/75 (BP Location: Left Arm)    Pulse 82    Temp 98.5 F (36.9 C)    Resp 18    SpO2 99%   Visual Acuity Right Eye Distance:   Left Eye Distance:   Bilateral Distance:    Right Eye Near:   Left Eye Near:    Bilateral Near:     Physical Exam Constitutional:      General: He is not in acute distress.    Appearance: He is well-developed. He is not ill-appearing.  Cardiovascular:     Rate and Rhythm: Normal rate and regular rhythm.     Pulses:          Carotid pulses are 2+ on the right side and 2+ on the left side.    Heart sounds: Normal heart sounds.     Comments: No carotid bruit Pulmonary:     Effort: Pulmonary effort is normal.  Chest:     Chest wall: No deformity or tenderness.  Musculoskeletal:     Right lower leg: No edema.     Left lower leg: No edema.  Neurological:     Mental Status: He is alert.     UC Treatments / Results  Labs (all labs ordered are listed, but only abnormal results are displayed) Labs Reviewed - No data to display  EKG EKG: normal EKG, normal sinus rhythm, nonspecific ST changes.   Radiology No results found.  Procedures Procedures (including critical care time)  Medications Ordered in UC Medications - No data to display  Initial Impression / Assessment and Plan / UC Course  I have reviewed the triage vital signs and the nursing notes.  Pertinent labs & imaging results that were available during my care of the patient were reviewed by me and considered in my medical decision making (see chart for details).    Had patient lay down while at urgent care, symptoms did not recur in supine position.  Pt does have hx hiatal hernia - this may be contributing to symptoms.   Pt to f/u with PCP. Reviewed reasons for seeking emergent care.   Final Clinical Impressions(s) / UC Diagnoses   Final diagnoses:   Atypical chest pain     Discharge Instructions      Please follow up with Seth Ferguson as soon as possible.   If you develop any concerning chest pain symptoms, please go to the ER.    ED Prescriptions   None    PDMP not reviewed this encounter.   Cathlyn ParsonsKabbe, Eiliana Drone M, NP 07/06/21 [phone]

## 2021-07-12 ENCOUNTER — Other Ambulatory Visit: Payer: Self-pay

## 2021-07-12 ENCOUNTER — Ambulatory Visit: Payer: 59 | Admitting: Medical

## 2021-07-12 VITALS — BP 120/84 | HR 74 | Wt 203.2 lb

## 2021-07-12 DIAGNOSIS — Z23 Encounter for immunization: Secondary | ICD-10-CM | POA: Diagnosis not present

## 2021-07-12 DIAGNOSIS — J4540 Moderate persistent asthma, uncomplicated: Secondary | ICD-10-CM | POA: Diagnosis not present

## 2021-07-12 DIAGNOSIS — R0789 Other chest pain: Secondary | ICD-10-CM | POA: Insufficient documentation

## 2021-07-12 DIAGNOSIS — K219 Gastro-esophageal reflux disease without esophagitis: Secondary | ICD-10-CM | POA: Diagnosis not present

## 2021-07-12 DIAGNOSIS — J45909 Unspecified asthma, uncomplicated: Secondary | ICD-10-CM | POA: Insufficient documentation

## 2021-07-12 MED ORDER — ALBUTEROL SULFATE HFA 108 (90 BASE) MCG/ACT IN AERS: 2.0000 | INHALATION_SPRAY | Freq: Four times a day (QID) | RESPIRATORY_TRACT | 2 refills | Status: DC | PRN

## 2021-07-12 MED ORDER — ADVAIR HFA 115-21 MCG/ACT IN AERO: 2.0000 | INHALATION_SPRAY | Freq: Two times a day (BID) | RESPIRATORY_TRACT | 12 refills | Status: DC

## 2021-07-12 MED ORDER — MONTELUKAST SODIUM 10 MG PO TABS: ORAL_TABLET | ORAL | 3 refills | Status: DC

## 2021-07-12 MED ORDER — OMEPRAZOLE 40 MG PO CPDR: 40.0000 mg | DELAYED_RELEASE_CAPSULE | Freq: Every day | ORAL | 0 refills | Status: DC

## 2021-07-12 NOTE — Progress Notes (Signed)
Subjective:  Seth Ferguson is a 56 y.o. male who presents for Chief Complaint  Patient presents with   UC follow-up    UC follow-up on chest pain but has hernia and doctor said that's whats causing pain.     Here for urgent care f/u.  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  Hasn't been here in past year.  Been dealing with new grand baby in past year and had to try to get custody due to some family issues  Been having some problems with chest pains, abdominal pains, problems with GERD vs hiatal hernia.     The day he went to urgent care had chest pain on right, weird feeling of right lower jaw, No sweats, no sob that day.   Does have hx/o asthma.  Had intermittent pains that day.  That day he had eaten some pizza.   No treatment given that day, just advised he f/u with PCP.   He ended up not taking anything that day other than increased water intake.    Asthma - was on Advair in the past.   Needs refills.  Lately has been using albuterol fairly regularly.   Wants refill on Singulair and Advair as well.  No other aggravating or relieving factors.    No other c/o.  Past Medical History:  Diagnosis Date   Allergy    Arthritis    back   Asthma    Chronic back pain    self reported 01/2016   Hiatal hernia    Neck pain 01/2016   self reported   Sarcoidosis 1998   ?   Wears glasses    Current Outpatient Medications on File Prior to Visit  Medication Sig Dispense Refill   albuterol (VENTOLIN HFA) 108 (90 Base) MCG/ACT inhaler INHALE 2 PUFFS INTO THE LUNGS EVERY 6 HOURS AS NEEDED FOR WHEEZING OR SHORTNESS OF BREATH 17 g 0   No current facility-administered medications on file prior to visit.     The following portions of the patient's history were reviewed and updated as appropriate: allergies, current medications, past family history, past medical history, past social history, past surgical history and problem list.  ROS Otherwise as in subjective  above    Objective: BP 120/84    Pulse 74    Wt 203 lb 3.2 oz (92.2 kg)    BMI 27.56 kg/m   General appearance: alert, no distress, well developed, well nourished Heart: RRR, normal S1, S2, no murmurs Lungs: CTA bilaterally, no wheezes, rhonchi, or rales Abdomen: +bs, soft, non tender, non distended, no masses, no hepatomegaly, no splenomegaly Pulses: 2+ radial pulses, 2+ pedal pulses, normal cap refill Ext: no edema    Assessment: Encounter Diagnoses  Name Primary?   Gastroesophageal reflux disease, unspecified whether esophagitis present Yes   Atypical chest pain    Moderate persistent chronic asthma without complication    Need for pneumococcal vaccination      Plan: GERD - avoid GERD triggers, begin trial of Omeprazole, can use Tums prn.   If not much improved over the next few weeks, then recheck.    Atypical chest pain - reviewed recent urgent care note.  Symptoms suggestive of GERD/indigestion.  C/t same plan as GERD above.  Asthma - restart Singulair and Advair for maintenance, can continue albuterol prn.  Avoid triggers.    Counseled on the pneumococcal vaccine.  Vaccine information sheet given.  Pneumococcal vaccine PPSV23 given after consent obtained.  [name] was seen today for uc follow-up.  Diagnoses and all orders for this visit:  Gastroesophageal reflux disease, unspecified whether esophagitis present  Atypical chest pain  Moderate persistent chronic asthma without complication -     montelukast (SINGULAIR) 10 MG tablet; TAKE 1 TABLET(10 MG) BY MOUTH AT BEDTIME  Need for pneumococcal vaccination -     Pneumococcal polysaccharide vaccine 23-valent greater than or equal to 2yo subcutaneous/IM  Other orders -     omeprazole (PRILOSEC) 40 MG capsule; Take 1 capsule (40 mg total) by mouth daily. -     fluticasone-salmeterol (ADVAIR HFA) 115-21 MCG/ACT inhaler; Inhale 2 puffs into the lungs 2 (two) times daily. -     albuterol (VENTOLIN HFA) 108 (90  Base) MCG/ACT inhaler; Inhale 2 puffs into the lungs every 6 (six) hours as needed for wheezing or shortness of breath.    Follow up: soon for fasting physical

## 2021-08-08 ENCOUNTER — Other Ambulatory Visit: Payer: Self-pay | Admitting: Medical

## 2021-09-11 ENCOUNTER — Ambulatory Visit (INDEPENDENT_AMBULATORY_CARE_PROVIDER_SITE_OTHER): Payer: 59 | Admitting: Medical

## 2021-09-11 ENCOUNTER — Other Ambulatory Visit: Payer: Self-pay

## 2021-09-11 ENCOUNTER — Encounter: Payer: Self-pay | Admitting: Medical

## 2021-09-11 VITALS — BP 120/80 | HR 81 | Ht 72.0 in | Wt 211.0 lb

## 2021-09-11 DIAGNOSIS — Z125 Encounter for screening for malignant neoplasm of prostate: Secondary | ICD-10-CM | POA: Diagnosis not present

## 2021-09-11 DIAGNOSIS — K449 Diaphragmatic hernia without obstruction or gangrene: Secondary | ICD-10-CM

## 2021-09-11 DIAGNOSIS — M549 Dorsalgia, unspecified: Secondary | ICD-10-CM

## 2021-09-11 DIAGNOSIS — Z0000 Encounter for general adult medical examination without abnormal findings: Secondary | ICD-10-CM | POA: Diagnosis not present

## 2021-09-11 DIAGNOSIS — Z7185 Encounter for immunization safety counseling: Secondary | ICD-10-CM

## 2021-09-11 DIAGNOSIS — R7989 Other specified abnormal findings of blood chemistry: Secondary | ICD-10-CM

## 2021-09-11 DIAGNOSIS — M542 Cervicalgia: Secondary | ICD-10-CM

## 2021-09-11 DIAGNOSIS — J4540 Moderate persistent asthma, uncomplicated: Secondary | ICD-10-CM

## 2021-09-11 DIAGNOSIS — K219 Gastro-esophageal reflux disease without esophagitis: Secondary | ICD-10-CM

## 2021-09-11 DIAGNOSIS — J301 Allergic rhinitis due to pollen: Secondary | ICD-10-CM | POA: Diagnosis not present

## 2021-09-11 DIAGNOSIS — Z13220 Encounter for screening for lipoid disorders: Secondary | ICD-10-CM

## 2021-09-11 DIAGNOSIS — Z862 Personal history of diseases of the blood and blood-forming organs and certain disorders involving the immune mechanism: Secondary | ICD-10-CM

## 2021-09-11 DIAGNOSIS — G8929 Other chronic pain: Secondary | ICD-10-CM

## 2021-09-11 DIAGNOSIS — Z131 Encounter for screening for diabetes mellitus: Secondary | ICD-10-CM

## 2021-09-11 NOTE — Progress Notes (Signed)
Subjective:  ? ?HPI ? Seth Ferguson is a 56 y.o. male who presents for ?Chief Complaint  ?Patient presents with  ? fasting cpe  ?  Fasting cpe, pulled muscle doing work in his attic   ? ? ?Patient Care Team: ?Devontaye Ground, Cleda Mccreedyavid S, PA-C as PCP - General (Family Medicine) ?Sees dentist ?Sees eye doctor ?Dr. Erick BlinksJay Pyrtle, and Doug SouJessica Zehr, PA ? ? ?Concerns: ?Here with his 53mo toddler grandson in his lap ? ?No concerns other than a little pulled muscle in right low back the last few days.  Feeling better today compared last few days.   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 ? ?Reviewed their medical, surgical, family, social, medication, and allergy history and updated chart as appropriate. ? ?Past Medical History:  ?Diagnosis Date  ? Allergy   ? Arthritis   ? back  ? Asthma   ? Chronic back pain   ? self reported 01/2016  ? Hiatal hernia   ? Neck pain 01/2016  ? self reported  ? Sarcoidosis 1998  ? ?  ? Wears glasses   ? ? ?Past Surgical History:  ?Procedure Laterality Date  ? CIRCUMCISION    ? COLONOSCOPY  2017  ? Dr. Vonna KotykJay Pyrtle  ? ? ?Family History  ?Problem Relation Age of Onset  ? Asthma Mother   ? Diabetes Mother   ? Other Mother   ?     died of "broken heart" per son  ? Stroke Mother   ? Seizures Mother   ? Other Father   ?     lung disease?  ? Pneumonia Father   ? Asthma Sister   ? Diabetes Sister   ? Asthma Brother   ? Diabetes Brother   ? Diabetes Sister   ? Heart disease Neg Hx   ? Hypertension Neg Hx   ? Cancer Neg Hx   ? Colon cancer Neg Hx   ? Colon polyps Neg Hx   ? Esophageal cancer Neg Hx   ? Rectal cancer Neg Hx   ? Stomach cancer Neg Hx   ? ? ? ?Current Outpatient Medications:  ?  albuterol (VENTOLIN HFA) 108 (90 Base) MCG/ACT inhaler, Inhale 2 puffs into the lungs every 6 (six) hours as needed for wheezing or shortness of breath., Disp: 18 g, Rfl: 2 ?  fluticasone-salmeterol (ADVAIR HFA) 115-21 MCG/ACT inhaler, Inhale 2 puffs into the lungs 2 (two) times daily., Disp: 1  each, Rfl: 12 ?  montelukast (SINGULAIR) 10 MG tablet, TAKE 1 TABLET(10 MG) BY MOUTH AT BEDTIME, Disp: 90 tablet, Rfl: 3 ?  omeprazole (PRILOSEC) 40 MG capsule, TAKE 1 CAPSULE(40 MG) BY MOUTH DAILY, Disp: 30 capsule, Rfl: 2 ? ?No Known Allergies ? ? ?Review of Systems ?Constitutional: -fever, -chills, -sweats, -unexpected weight change, -decreased appetite, -fatigue ?Allergy: -sneezing, -itching, -congestion ?Dermatology: -changing moles, --rash, -lumps ?ENT: -runny nose, -ear pain, -sore throat, -hoarseness, -sinus pain, -teeth pain, - ringing in ears, -hearing loss, -nosebleeds ?Cardiology: -chest pain, -palpitations, -swelling, -difficulty breathing when lying flat, -waking up short of breath ?Respiratory: -cough, -shortness of breath, -difficulty breathing with exercise or exertion, -wheezing, -coughing up blood ?Gastroenterology: -abdominal pain, -nausea, -vomiting, -diarrhea, -constipation, -blood in stool, -changes in bowel movement, -difficulty swallowing or eating ?Hematology: -bleeding, -bruising  ?Musculoskeletal: -joint aches, +recent spasm/strain of back muscle aches, -joint swelling, -back pain, -neck pain, -cramping, -changes in gait ?Ophthalmology: denies vision changes, eye redness, itching, discharge ?Urology: -burning with urination, -difficulty urinating, -blood  in urine, -urinary frequency, -urgency, -incontinence ?Neurology: -headache, -weakness, -tingling, -numbness, -memory loss, -falls, -dizziness ?Psychology: -depressed mood, -agitation, -sleep problems ?Male GU: no testicular mass, pain, no lymph nodes swollen, no swelling, no rash. ? ? ?  09/11/2021  ?  8:51 AM 07/12/2021  ? 11:14 AM 10/08/2019  ?  8:15 AM 01/18/2016  ?  3:46 PM  ?Depression screen PHQ 2/9  ?Decreased Interest 0 0 0 0  ?Down, Depressed, Hopeless 0 0 0 0  ?PHQ - 2 Score 0 0 0 0  ? ? ?   ? ?Objective:  ?BP 120/80   Pulse 81   Ht 6' (1.829 m)   Wt 211 lb (95.7 kg)   BMI 28.62 kg/m?  ? ?General appearance: alert, no distress,  WD/WN, African American male ?Skin: unremarkable ?HEENT: normocephalic, conjunctiva/corneas normal, sclerae anicteric, PERRLA, EOM ?Neck: supple, no lymphadenopathy, no thyromegaly, no masses, normal ROM, no bruits ?Chest: non tender, normal shape and expansion ?Heart: RRR, normal S1, S2, no murmurs ?Lungs: CTA bilaterally, no wheezes, rhonchi, or rales ?Abdomen: +bs, soft, non tender, non distended, no masses, no hepatomegaly, no splenomegaly, no bruits ?Back: non tender, normal ROM, no scoliosis ?Musculoskeletal: mild tenderness in right lumbar area paraspinal, otherwise upper extremities non tender, no obvious deformity, normal ROM throughout, lower extremities non tender, no obvious deformity, normal ROM throughout ?Extremities: no edema, no cyanosis, no clubbing ?Pulses: 2+ symmetric, upper and lower extremities, normal cap refill ?Neurological: alert, oriented x 3, CN2-12 intact, strength normal upper extremities and lower extremities, sensation normal throughout, DTRs 2+ throughout, no cerebellar signs, gait normal ?Psychiatric: normal affect, behavior normal, pleasant  ?GU/rectal - deferred ? ? ?Assessment and Plan :  ? ?Encounter Diagnoses  ?Name Primary?  ? Encounter for health maintenance examination in adult Yes  ? Vaccine counseling   ? Screening for prostate cancer   ? Allergic rhinitis due to pollen, unspecified seasonality   ? History of sarcoidosis   ? Moderate persistent chronic asthma without complication   ? Chronic back pain, unspecified back location, unspecified back pain laterality   ? Chronic neck pain   ? Elevated LFTs   ? Gastroesophageal reflux disease, unspecified whether esophagitis present   ? Hiatal hernia   ? Screening for diabetes mellitus   ? Screening for lipid disorders   ? ? ?This visit was a preventative care visit, also known as wellness visit or routine physical.   Topics typically include healthy lifestyle, diet, exercise, preventative care, vaccinations, sick and well care,  proper use of emergency dept and after hours care, as well as other concerns.   ? ? ?Recommendations: ?Continue to return yearly for your annual wellness and preventative care visits.  This gives [place] a chance to discuss healthy lifestyle, exercise, vaccinations, review your chart record, and perform screenings where appropriate. ? ?I recommend you see your eye doctor yearly for routine vision care. ? ?I recommend you see your dentist yearly for routine dental care including hygiene visits twice yearly. ? ? ?Vaccination recommendations were reviewed ?Immunization History  ?Administered Date(s) Administered  ? Influenza-Unspecified 05/18/2021  ? PFIZER(Purple Top)SARS-COV-2 Vaccination 09/10/2019, 10/05/2019  ? Pneumococcal Polysaccharide-23 07/12/2021  ? Tdap 12/08/2019  ? Zoster Recombinat (Shingrix) 12/08/2019, 02/09/2020  ? ? ? ? ?Screening for cancer: ?I reviewed your colonoscopy on file that is up to date from 2017, due repeat 2027 ? ?We discussed PSA, prostate exam, and prostate cancer screening risks/benefits.    ? ?Skin cancer screening: ?Check your skin regularly for new  changes, growing lesions, or other lesions of concern ?Come in for evaluation if you have skin lesions of concern. ? ?Lung cancer screening: ?If you have a greater than 20 pack year history of tobacco use, then you may qualify for lung cancer screening with a chest CT scan.   Please call your insurance company to inquire about coverage for this test. ? ?We currently don't have screenings for other cancers besides breast, cervical, colon, and lung cancers.  If you have a strong family history of cancer or have other cancer screening concerns, please let me know.  ? ? ?Bone health: ?Get at least 150 minutes of aerobic exercise weekly ?Get weight bearing exercise at least once weekly ?Bone density test:  ?A bone density test is an imaging test that uses a type of X-ray to measure the amount of calcium and other minerals in your bones. ?The test  may be used to diagnose or screen you for a condition that causes weak or thin bones (osteoporosis), predict your risk for a broken bone (fracture), or determine how well your osteoporosis treatment is w

## 2021-09-12 LAB — COMPREHENSIVE METABOLIC PANEL
ALT: 33 IU/L (ref 0–44)
AST: 28 IU/L (ref 0–40)
Albumin/Globulin Ratio: 1.4 (ref 1.2–2.2)
Albumin: 4.4 g/dL (ref 3.8–4.9)
Alkaline Phosphatase: 57 IU/L (ref 44–121)
BUN/Creatinine Ratio: 15 (ref 9–20)
BUN: 17 mg/dL (ref 6–24)
Bilirubin Total: 0.4 mg/dL (ref 0.0–1.2)
CO2: 24 mmol/L (ref 20–29)
Calcium: 9.4 mg/dL (ref 8.7–10.2)
Chloride: 103 mmol/L (ref 96–106)
Creatinine, Ser: 1.16 mg/dL (ref 0.76–1.27)
Globulin, Total: 3.2 g/dL (ref 1.5–4.5)
Glucose: 93 mg/dL (ref 70–99)
Potassium: 5.1 mmol/L (ref 3.5–5.2)
Sodium: 138 mmol/L (ref 134–144)
Total Protein: 7.6 g/dL (ref 6.0–8.5)
eGFR: 74 mL/min/{1.73_m2} (ref >59)

## 2021-09-12 LAB — CBC
Hematocrit: 42 % (ref 37.5–51.0)
Hemoglobin: 14.5 g/dL (ref 13.0–17.7)
MCH: 30.5 pg (ref 26.6–33.0)
MCHC: 34.5 g/dL (ref 31.5–35.7)
MCV: 88 fL (ref 79–97)
Platelets: 241 10*3/uL (ref 150–450)
RBC: 4.75 x10E6/uL (ref 4.14–5.80)
RDW: 12.1 % (ref 11.6–15.4)
WBC: 5.1 10*3/uL (ref 3.4–10.8)

## 2021-09-12 LAB — LIPID PANEL
Chol/HDL Ratio: 3 ratio (ref 0.0–5.0)
Cholesterol, Total: 148 mg/dL (ref 100–199)
HDL: 50 mg/dL (ref >39)
LDL Chol Calc (NIH): 83 mg/dL (ref 0–99)
Triglycerides: 79 mg/dL (ref 0–149)
VLDL Cholesterol Cal: 15 mg/dL (ref 5–40)

## 2021-09-12 LAB — HEMOGLOBIN A1C
Est. average glucose Bld gHb Est-mCnc: 126 mg/dL
Hgb A1c MFr Bld: 6 % — ABNORMAL HIGH (ref 4.8–5.6)

## 2021-09-12 LAB — PSA: Prostate Specific Ag, Serum: 0.3 ng/mL (ref 0.0–4.0)

## 2021-09-25 ENCOUNTER — Other Ambulatory Visit: Payer: Self-pay | Admitting: Medical

## 2021-10-08 IMAGING — US US ABDOMEN COMPLETE
1 series · 13 of 25 positions shown · non-contrast
Comparison: None.

CLINICAL DATA: 53-year-old male with elevated LFTs.

EXAM:
ABDOMEN ULTRASOUND COMPLETE

[Series 1: us abdomen complete · 0.26mm/px · 13 of 103 slices shown]
[im 1/103]
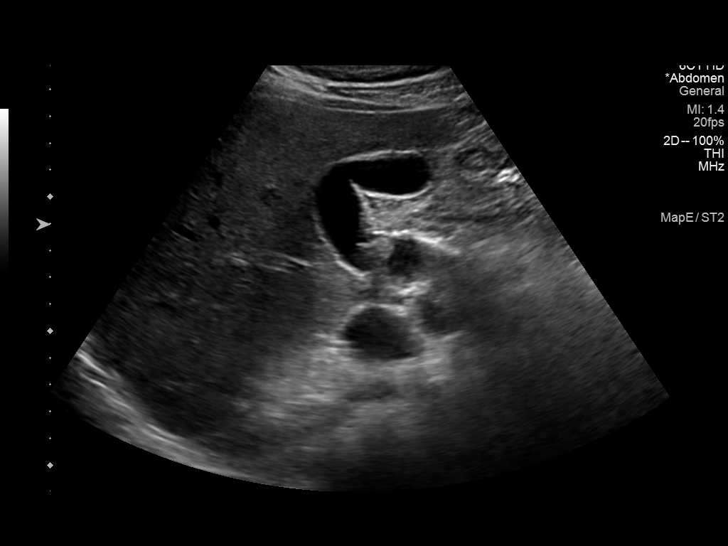
[im 9/103]
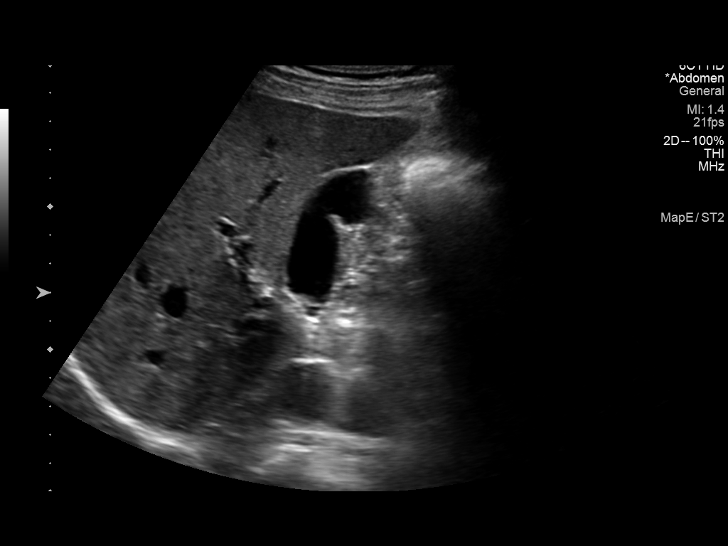
[im 18/103]
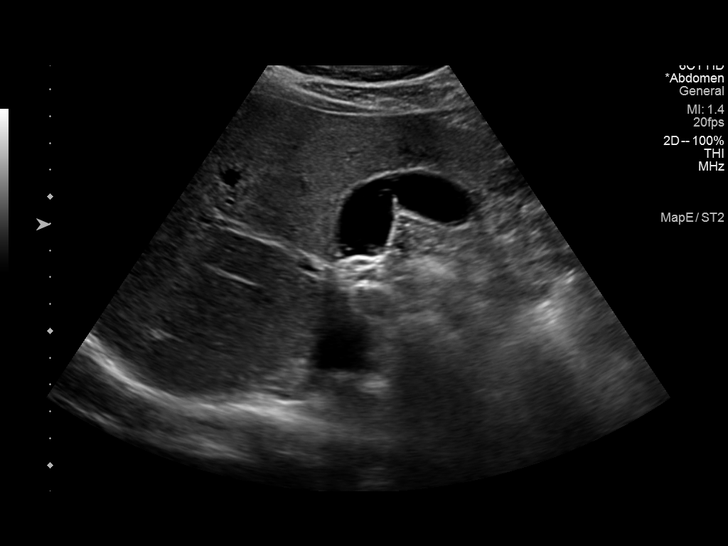
[im 26/103]
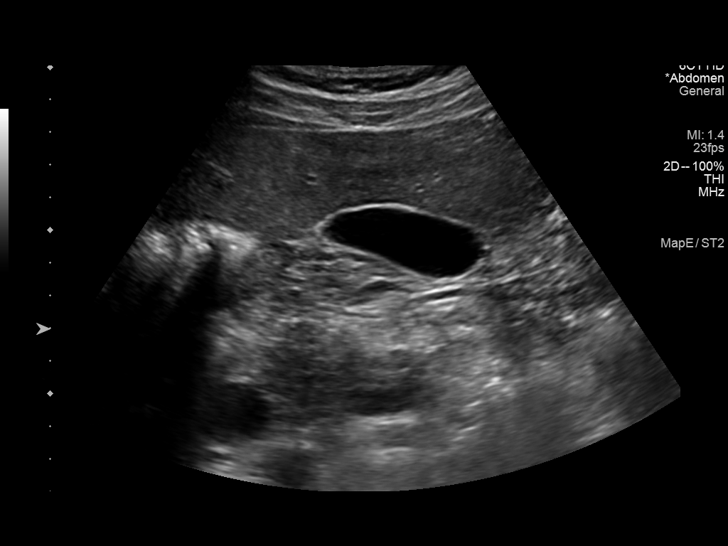
[im 35/103]
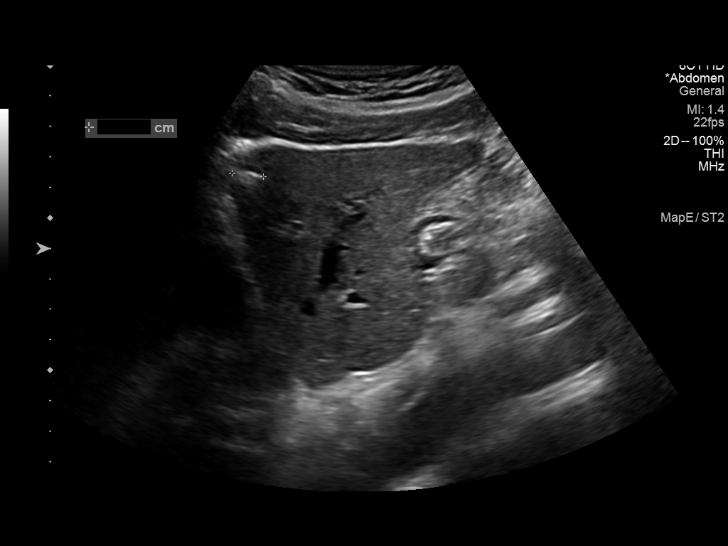
[im 43/103]
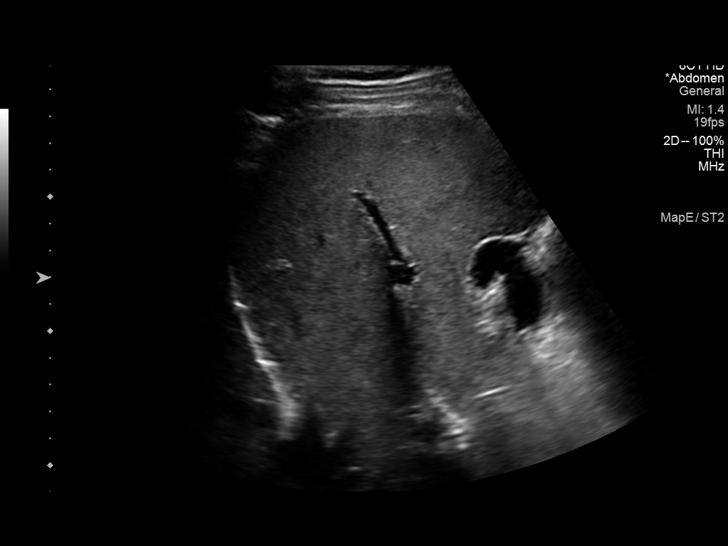
[im 52/103]
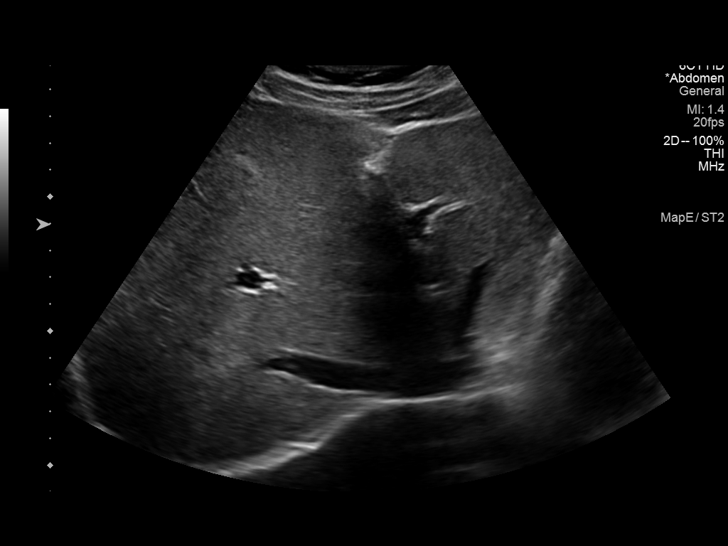
[im 60/103]
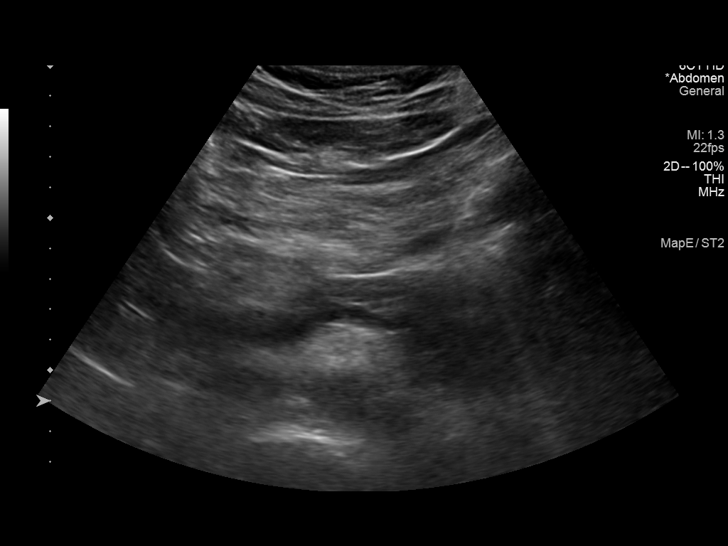
[im 69/103]
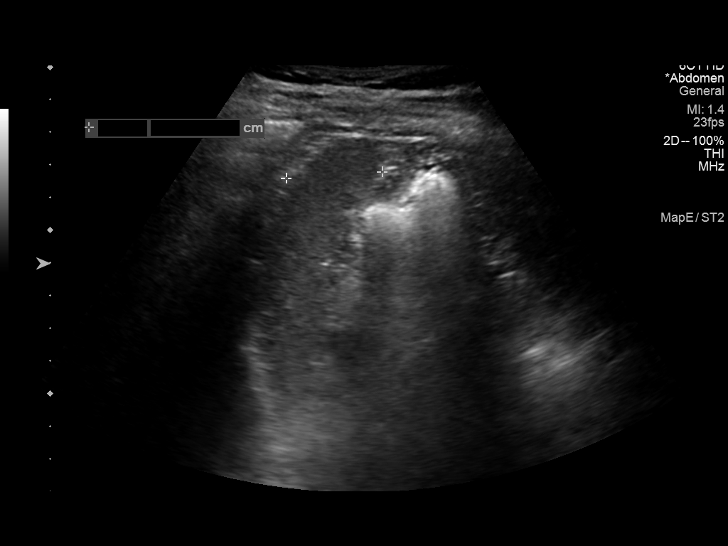
[im 77/103]
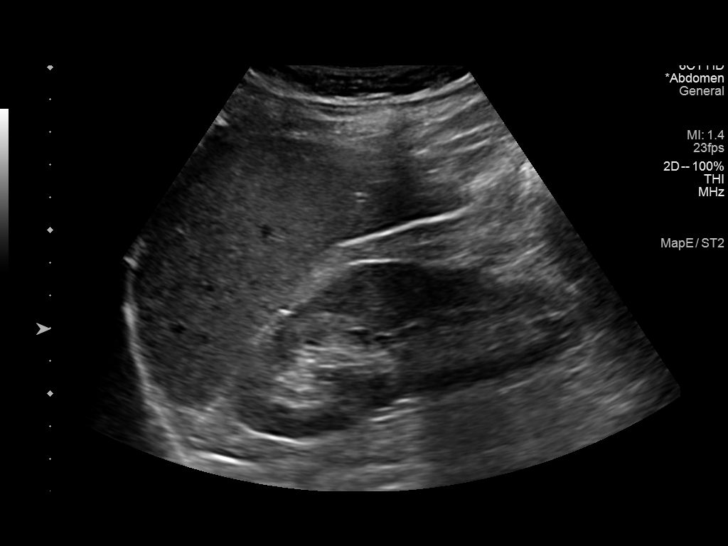
[im 86/103]
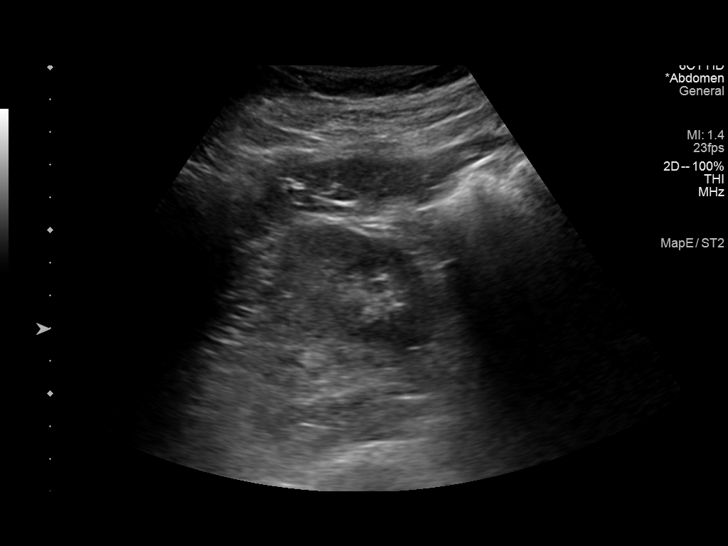
[im 94/103]
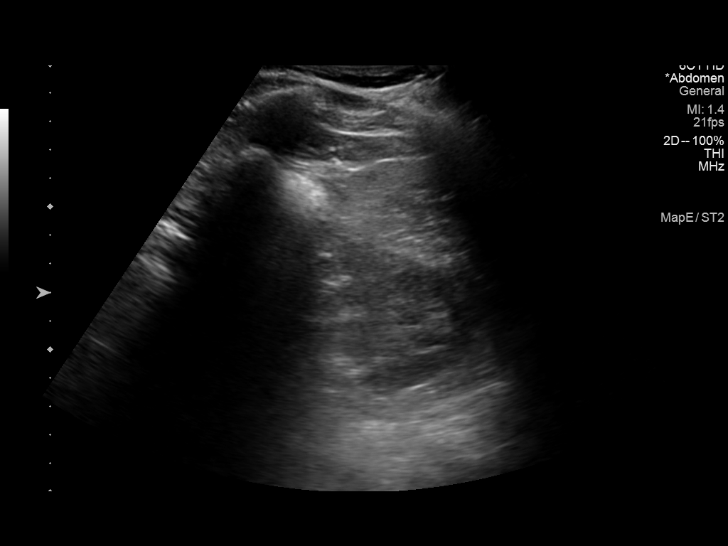
[im 103/103]
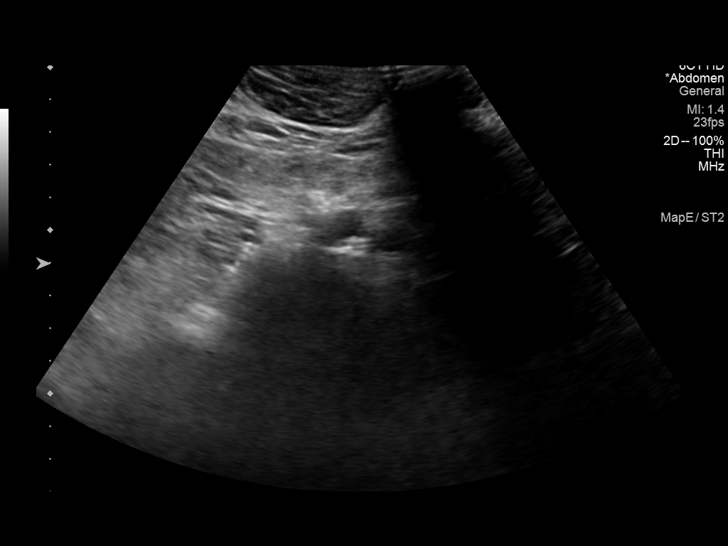

[13 of 25 positions shown; findings below may reference images not displayed]

FINDINGS: Gallbladder: There is no evidence of cholelithiasis or acute
cholecystitis. Minimal gallbladder sludge noted.

Common bile duct: Diameter: 3.5 mm. No intrahepatic or extrahepatic
biliary dilatation.

Liver: No focal lesion identified. Within normal limits in
parenchymal echogenicity. Portal vein is patent on color Doppler
imaging with normal direction of blood flow towards the liver.

IVC: No abnormality visualized.

Pancreas: Visualized portion unremarkable.

Spleen: A 1.4 cm hyperechoic mass within the anterior spleen is
noted, most likely an angioma.

Right Kidney: Length: 11.3 cm. Echogenicity within normal limits. No
mass or hydronephrosis visualized.

Left Kidney: Length: 10.9 cm. Echogenicity within normal limits. No
mass or hydronephrosis visualized.

Abdominal aorta: No aneurysm visualized.

Other findings: None.
IMPRESSION: 1. No significant hepatic abnormalities.  No biliary dilatation.
2. 1.4 cm hyperechoic splenic lesion, statistically benign and most
likely represents an angioma.
3. Minimal gallbladder sludge. No evidence of cholelithiasis or
acute cholecystitis.

## 2021-10-25 ENCOUNTER — Other Ambulatory Visit: Payer: Self-pay | Admitting: Medical

## 2021-11-05 ENCOUNTER — Other Ambulatory Visit: Payer: Self-pay | Admitting: Medical

## 2021-11-22 ENCOUNTER — Other Ambulatory Visit: Payer: Self-pay | Admitting: Medical

## 2022-01-06 ENCOUNTER — Other Ambulatory Visit: Payer: Self-pay | Admitting: Medical

## 2022-01-09 NOTE — Telephone Encounter (Signed)
This ok to refill. 

## 2022-02-05 ENCOUNTER — Other Ambulatory Visit: Payer: Self-pay | Admitting: Medical

## 2022-02-21 ENCOUNTER — Encounter: Payer: Self-pay | Admitting: Internal Medicine

## 2022-02-24 ENCOUNTER — Other Ambulatory Visit: Payer: Self-pay | Admitting: Medical

## 2022-03-27 ENCOUNTER — Encounter: Payer: Self-pay | Admitting: Internal Medicine

## 2022-03-27 ENCOUNTER — Other Ambulatory Visit: Payer: Self-pay | Admitting: Medical

## 2022-04-11 ENCOUNTER — Encounter: Payer: Self-pay | Admitting: Medical

## 2022-09-13 ENCOUNTER — Encounter: Payer: 59 | Admitting: Medical

## 2022-09-28 ENCOUNTER — Encounter: Payer: Self-pay | Admitting: Medical

## 2022-09-28 ENCOUNTER — Ambulatory Visit (INDEPENDENT_AMBULATORY_CARE_PROVIDER_SITE_OTHER): Payer: 59 | Admitting: Medical

## 2022-09-28 VITALS — BP 122/70 | HR 73 | Ht 72.0 in | Wt 203.4 lb

## 2022-09-28 DIAGNOSIS — Z125 Encounter for screening for malignant neoplasm of prostate: Secondary | ICD-10-CM | POA: Diagnosis not present

## 2022-09-28 DIAGNOSIS — Z131 Encounter for screening for diabetes mellitus: Secondary | ICD-10-CM

## 2022-09-28 DIAGNOSIS — Z1211 Encounter for screening for malignant neoplasm of colon: Secondary | ICD-10-CM

## 2022-09-28 DIAGNOSIS — J301 Allergic rhinitis due to pollen: Secondary | ICD-10-CM

## 2022-09-28 DIAGNOSIS — Z0000 Encounter for general adult medical examination without abnormal findings: Secondary | ICD-10-CM | POA: Diagnosis not present

## 2022-09-28 DIAGNOSIS — Z13220 Encounter for screening for lipoid disorders: Secondary | ICD-10-CM

## 2022-09-28 DIAGNOSIS — J4540 Moderate persistent asthma, uncomplicated: Secondary | ICD-10-CM | POA: Diagnosis not present

## 2022-09-28 LAB — POCT URINALYSIS DIP (PROADVANTAGE DEVICE)
Glucose, UA: NEGATIVE mg/dL
Ketones, POC UA: NEGATIVE mg/dL
Leukocytes, UA: NEGATIVE
Nitrite, UA: NEGATIVE
Protein Ur, POC: NEGATIVE mg/dL
Specific Gravity, Urine: 1.02
Urobilinogen, Ur: NEGATIVE
pH, UA: 6.5 (ref 5.0–8.0)

## 2022-09-28 LAB — LIPID PANEL

## 2022-09-28 NOTE — Progress Notes (Signed)
Subjective:   HPI  Seth Ferguson is a 57 y.o. male who presents for Chief Complaint  Patient presents with   Annual Exam    Fasting CPE, annual exam. No other concerns. Pt goes to see Dr. Chilton Si for dentistry and sees and eye doctor but does not remember the name or company.     Today with his 2 young grandsons   Patient Care Team: Satchel Heidinger, Cleda Mccreedy as PCP - General (Family Medicine) Sees dentist Sees eye doctor Dr. Erick Blinks, and Doug Sou, PA   Concerns: None, doing well  Asthma-no recent concerns.  Takes Singulair daily.  Has not been using Advair as he has not needed.  Has not used albuterol lately.  He has plenty refills at home of these medicines well  GERD-no recent issues, he does still take omeprazole regularly   Reviewed their medical, surgical, family, social, medication, and allergy history and updated chart as appropriate.  Past Medical History:  Diagnosis Date   Allergy    Arthritis    back   Asthma    Chronic back pain    self reported 01/2016   Hiatal hernia    Neck pain 01/2016   self reported   Sarcoidosis 1998   ?   Wears glasses     Past Surgical History:  Procedure Laterality Date   CIRCUMCISION     COLONOSCOPY  2017   Dr. Erick Blinks    Family History  Problem Relation Age of Onset   Asthma Mother    Diabetes Mother    Other Mother        died of "broken heart" per son   Stroke Mother    Seizures Mother    Other Father        lung disease?   Pneumonia Father    Asthma Sister    Diabetes Sister    Asthma Brother    Diabetes Brother    Diabetes Sister    Heart disease Neg Hx    Hypertension Neg Hx    Cancer Neg Hx    Colon cancer Neg Hx    Colon polyps Neg Hx    Esophageal cancer Neg Hx    Rectal cancer Neg Hx    Stomach cancer Neg Hx      Current Outpatient Medications:    albuterol (VENTOLIN HFA) 108 (90 Base) MCG/ACT inhaler, INHALE 2 PUFFS INTO THE LUNGS EVERY 6 HOURS AS NEEDED FOR WHEEZING OR SHORTNESS  OF BREATH, Disp: 6.7 g, Rfl: 0   montelukast (SINGULAIR) 10 MG tablet, TAKE 1 TABLET(10 MG) BY MOUTH AT BEDTIME, Disp: 90 tablet, Rfl: 3  No Known Allergies   Review of Systems  Constitutional:  Negative for chills, fever, malaise/fatigue and weight loss.  HENT:  Negative for congestion, ear pain, hearing loss, sore throat and tinnitus.   Eyes:  Negative for blurred vision, pain and redness.  Respiratory:  Negative for cough, hemoptysis and shortness of breath.   Cardiovascular:  Negative for chest pain, palpitations, orthopnea, claudication and leg swelling.  Gastrointestinal:  Negative for abdominal pain, blood in stool, constipation, diarrhea, nausea and vomiting.  Genitourinary:  Negative for dysuria, flank pain, frequency, hematuria and urgency.  Musculoskeletal:  Negative for falls, joint pain and myalgias.  Skin:  Negative for itching and rash.  Neurological:  Negative for dizziness, tingling, speech change, weakness and headaches.  Endo/Heme/Allergies:  Negative for polydipsia. Does not bruise/bleed easily.  Psychiatric/Behavioral:  Negative for depression and memory loss. The  patient is not nervous/anxious and does not have insomnia.         09/28/2022    8:11 AM 09/11/2021    8:51 AM 07/12/2021   11:14 AM 10/08/2019    8:15 AM 01/18/2016    3:46 PM  Depression screen PHQ 2/9  Decreased Interest 0 0 0 0 0  Down, Depressed, Hopeless 0 0 0 0 0  PHQ - 2 Score 0 0 0 0 0        Objective:  BP 122/70   Pulse 73   Ht 6' (1.829 m)   Wt 203 lb 6.4 oz (92.3 kg)   SpO2 (!) 89%   BMI 27.59 kg/m   Wt Readings from Last 3 Encounters:  09/28/22 203 lb 6.4 oz (92.3 kg)  09/11/21 211 lb (95.7 kg)  07/12/21 203 lb 3.2 oz (92.2 kg)   BP Readings from Last 3 Encounters:  09/28/22 122/70  09/11/21 120/80  07/12/21 120/84    General appearance: alert, no distress, WD/WN, African American male Skin: unremarkable HEENT: normocephalic, conjunctiva/corneas normal, sclerae  anicteric, PERRLA, EOM Neck: supple, no lymphadenopathy, no thyromegaly, no masses, normal ROM, no bruits Chest: non tender, normal shape and expansion Heart: RRR, normal S1, S2, no murmurs Lungs: CTA bilaterally, no wheezes, rhonchi, or rales Abdomen: +bs, soft, non tender, non distended, no masses, no hepatomegaly, no splenomegaly, no bruits Back: non tender, normal ROM, no scoliosis Musculoskeletal: no obvious deformity, normal ROM throughout, lower extremities non tender, no obvious deformity, normal ROM throughout Extremities: no edema, no cyanosis, no clubbing Pulses: 2+ symmetric, upper and lower extremities, normal cap refill Neurological: alert, oriented x 3, CN2-12 intact, strength normal upper extremities and lower extremities, sensation normal throughout, DTRs 2+ throughout, no cerebellar signs, gait normal Psychiatric: normal affect, behavior normal, pleasant  GU/rectal - deferred   Assessment and Plan :   Encounter Diagnoses  Name Primary?   Encounter for health maintenance examination in adult Yes   Screening for prostate cancer    Moderate persistent chronic asthma without complication    Allergic rhinitis due to pollen, unspecified seasonality    Screening for lipid disorders    Screening for diabetes mellitus    Screening for colon cancer     This visit was a preventative care visit, also known as wellness visit or routine physical.   Topics typically include healthy lifestyle, diet, exercise, preventative care, vaccinations, sick and well care, proper use of emergency dept and after hours care, as well as other concerns.     Recommendations: Continue to return yearly for your annual wellness and preventative care visits.  This gives [place] a chance to discuss healthy lifestyle, exercise, vaccinations, review your chart record, and perform screenings where appropriate.  I recommend you see your eye doctor yearly for routine vision care.  I recommend you see your  dentist yearly for routine dental care including hygiene visits twice yearly.   Vaccination recommendations were reviewed Immunization History  Administered Date(s) Administered   Influenza-Unspecified 05/18/2021   PFIZER(Purple Top)SARS-COV-2 Vaccination 09/10/2019, 10/05/2019   Pneumococcal Polysaccharide-23 07/12/2021   Tdap 12/08/2019   Zoster Recombinat (Shingrix) 12/08/2019, 02/09/2020   Covid booster declined   Screening for cancer: I reviewed your colonoscopy on file that is up to date from 2017, due repeat 2027.  Sent home with FIT testing/stool test.  We discussed PSA, prostate exam, and prostate cancer screening risks/benefits.     Skin cancer screening: Check your skin regularly for new changes, growing lesions, or  other lesions of concern Come in for evaluation if you have skin lesions of concern.  Lung cancer screening: If you have a greater than 20 pack year history of tobacco use, then you may qualify for lung cancer screening with a chest CT scan.   Please call your insurance company to inquire about coverage for this test.  We currently don't have screenings for other cancers besides breast, cervical, colon, and lung cancers.  If you have a strong family history of cancer or have other cancer screening concerns, please let me know.    Bone health: Get at least 150 minutes of aerobic exercise weekly Get weight bearing exercise at least once weekly Bone density test:  A bone density test is an imaging test that uses a type of X-ray to measure the amount of calcium and other minerals in your bones. The test may be used to diagnose or screen you for a condition that causes weak or thin bones (osteoporosis), predict your risk for a broken bone (fracture), or determine how well your osteoporosis treatment is working. The bone density test is recommended for females 65 and older, or females or males <65 if certain risk factors such as thyroid disease, long term use of  steroids such as for asthma or rheumatological issues, vitamin D deficiency, estrogen deficiency, family history of osteoporosis, self or family history of fragility fracture in first degree relative.    Heart health: Get at least 150 minutes of aerobic exercise weekly Limit alcohol It is important to maintain a healthy blood pressure and healthy cholesterol numbers  Heart disease screening: Screening for heart disease includes screening for blood pressure, fasting lipids, glucose/diabetes screening, BMI height to weight ratio, reviewed of smoking status, physical activity, and diet.    Goals include blood pressure 120/80 or less, maintaining a healthy lipid/cholesterol profile, preventing diabetes or keeping diabetes numbers under good control, not smoking or using tobacco products, exercising most days per week or at least 150 minutes per week of exercise, and eating healthy variety of fruits and vegetables, healthy oils, and avoiding unhealthy food choices like fried food, fast food, high sugar and high cholesterol foods.    Other tests may possibly include EKG test, CT coronary calcium score, echocardiogram, exercise treadmill stress test.   Consider CT coronary heart screening   Medical care options: I recommend you continue to seek care here first for routine care.  We try really hard to have available appointments Monday through Friday daytime hours for sick visits, acute visits, and physicals.  Urgent care should be used for after hours and weekends for significant issues that cannot wait till the next day.  The emergency department should be used for significant potentially life-threatening emergencies.  The emergency department is expensive, can often have long wait times for less significant concerns, so try to utilize primary care, urgent care, or telemedicine when possible to avoid unnecessary trips to the emergency department.  Virtual visits and telemedicine have been introduced  since the pandemic started in 2020, and can be convenient ways to receive medical care.  We offer virtual appointments as well to assist you in a variety of options to seek medical care.   Advanced Directives: I recommend you consider completing a Health Care Power of Attorney and Living Will.   These documents respect your wishes and help alleviate burdens on your loved ones if you were to become terminally ill or be in a position to need those documents enforced.    You  can complete Advanced Directives yourself, have them notarized, then have copies made for our office, for you and for anybody you feel should have them in safe keeping.  Or, you can have an attorney prepare these documents.   If you haven't updated your Last Will and Testament in a while, it may be worthwhile having an attorney prepare these documents together and save on some costs.       Separate significant issues discussed:  Patient Instructions  GERD/acid reflux Try using either a lower dose omeprazole 20 mg over-the-counter instead of your 40 mg omeprazole reflux medicine.  Omeprazole is taken in the morning 30 to 40 minutes before breakfast Or consider famotidine Pepcid at nighttime which is a safer acid reflux medication You may find it since you lost weight, you may not need to take acid reflux medicine at all Antacids such as Tums are always okay as needed Limit or avoid spicy and acidic foods, avoid eating close to bedtime, do not rush through meals There are long-term risk of medicines like omeprazole as it can put you at risk for osteoporosis So if you do not need to be on omeprazole then discontinue this    Asthma Continue Singulair daily for preventative measure for allergies and asthma Currently you are not having to use her Advair prevention inhaler which is fine You can still use albuterol rescue inhaler, 2 puffs as needed every 4-6 hours    We will call with lab results     I really and not  having with heart. Well. Recheck everything between the    Rainn was seen today for annual exam.  Diagnoses and all orders for this visit:  Encounter for health maintenance examination in adult -     Comprehensive metabolic panel -     CBC -     Hemoglobin A1c -     Lipid panel -     PSA -     POCT Urinalysis DIP (Proadvantage Device)  Screening for prostate cancer -     PSA  Moderate persistent chronic asthma without complication  Allergic rhinitis due to pollen, unspecified seasonality  Screening for lipid disorders -     Lipid panel  Screening for diabetes mellitus -     Hemoglobin A1c  Screening for colon cancer -     Fecal occult blood, imunochemical    Follow-up pending labs, yearly for physical

## 2022-09-28 NOTE — Patient Instructions (Addendum)
GERD/acid reflux Try using either a lower dose omeprazole 20 mg over-the-counter instead of your 40 mg omeprazole reflux medicine.  Omeprazole is taken in the morning 30 to 40 minutes before breakfast Or consider famotidine Pepcid at nighttime which is a safer acid reflux medication You may find it since you lost weight, you may not need to take acid reflux medicine at all Antacids such as Tums are always okay as needed Limit or avoid spicy and acidic foods, avoid eating close to bedtime, do not rush through meals There are long-term risk of medicines like omeprazole as it can put you at risk for osteoporosis So if you do not need to be on omeprazole then discontinue this    Asthma Continue Singulair daily for preventative measure for allergies and asthma Currently you are not having to use her Advair prevention inhaler which is fine You can still use albuterol rescue inhaler, 2 puffs as needed every 4-6 hours    We will call with lab results     I really and not having with heart. Well. Recheck everything between the

## 2022-09-29 LAB — CBC
Hematocrit: 41.8 % (ref 37.5–51.0)
Hemoglobin: 13.8 g/dL (ref 13.0–17.7)
MCH: 29.9 pg (ref 26.6–33.0)
MCHC: 33 g/dL (ref 31.5–35.7)
MCV: 91 fL (ref 79–97)
Platelets: 260 10*3/uL (ref 150–450)
RBC: 4.61 x10E6/uL (ref 4.14–5.80)
RDW: 13.1 % (ref 11.6–15.4)
WBC: 5.1 10*3/uL (ref 3.4–10.8)

## 2022-09-29 LAB — LIPID PANEL
Chol/HDL Ratio: 3.2 ratio (ref 0.0–5.0)
Cholesterol, Total: 149 mg/dL (ref 100–199)
HDL: 46 mg/dL (ref >39)
LDL Chol Calc (NIH): 87 mg/dL (ref 0–99)
Triglycerides: 85 mg/dL (ref 0–149)
VLDL Cholesterol Cal: 16 mg/dL (ref 5–40)

## 2022-09-29 LAB — COMPREHENSIVE METABOLIC PANEL
ALT: 28 IU/L (ref 0–44)
AST: 21 IU/L (ref 0–40)
Albumin/Globulin Ratio: 1.5 (ref 1.2–2.2)
Albumin: 4.4 g/dL (ref 3.8–4.9)
Alkaline Phosphatase: 53 IU/L (ref 44–121)
BUN/Creatinine Ratio: 16 (ref 9–20)
BUN: 18 mg/dL (ref 6–24)
Bilirubin Total: 0.5 mg/dL (ref 0.0–1.2)
CO2: 25 mmol/L (ref 20–29)
Calcium: 9.2 mg/dL (ref 8.7–10.2)
Chloride: 98 mmol/L (ref 96–106)
Creatinine, Ser: 1.1 mg/dL (ref 0.76–1.27)
Globulin, Total: 3 g/dL (ref 1.5–4.5)
Glucose: 83 mg/dL (ref 70–99)
Potassium: 4.4 mmol/L (ref 3.5–5.2)
Sodium: 137 mmol/L (ref 134–144)
Total Protein: 7.4 g/dL (ref 6.0–8.5)
eGFR: 79 mL/min/{1.73_m2} (ref >59)

## 2022-09-29 LAB — HEMOGLOBIN A1C
Est. average glucose Bld gHb Est-mCnc: 126 mg/dL
Hgb A1c MFr Bld: 6 % — ABNORMAL HIGH (ref 4.8–5.6)

## 2022-09-29 LAB — PSA: Prostate Specific Ag, Serum: 0.3 ng/mL (ref 0.0–4.0)

## 2022-09-30 ENCOUNTER — Other Ambulatory Visit: Payer: Self-pay | Admitting: Medical

## 2022-09-30 DIAGNOSIS — J4540 Moderate persistent asthma, uncomplicated: Secondary | ICD-10-CM

## 2022-09-30 MED ORDER — ALBUTEROL SULFATE HFA 108 (90 BASE) MCG/ACT IN AERS: 2.0000 | INHALATION_SPRAY | Freq: Four times a day (QID) | RESPIRATORY_TRACT | 0 refills | Status: DC | PRN

## 2022-09-30 MED ORDER — MONTELUKAST SODIUM 10 MG PO TABS
ORAL_TABLET | ORAL | 3 refills | Status: DC
Start: 2022-09-30 — End: 2023-12-31

## 2022-09-30 NOTE — Progress Notes (Signed)
Results sent through MyChart

## 2022-10-02 LAB — FECAL OCCULT BLOOD, IMMUNOCHEMICAL: Fecal Occult Bld: NEGATIVE

## 2022-10-03 NOTE — Progress Notes (Signed)
Results sent through MyChart

## 2022-10-26 ENCOUNTER — Other Ambulatory Visit: Payer: Self-pay | Admitting: Medical

## 2023-12-06 ENCOUNTER — Encounter: Payer: Self-pay | Admitting: Nurse Practitioner

## 2023-12-06 ENCOUNTER — Ambulatory Visit: Admitting: Nurse Practitioner

## 2023-12-06 VITALS — BP 128/78 | HR 78 | Wt 201.8 lb

## 2023-12-06 DIAGNOSIS — H66003 Acute suppurative otitis media without spontaneous rupture of ear drum, bilateral: Secondary | ICD-10-CM

## 2023-12-06 MED ORDER — PREDNISONE 20 MG PO TABS
20.0000 mg | ORAL_TABLET | Freq: Every day | ORAL | 0 refills | Status: DC
Start: 2023-12-06 — End: 2024-01-14

## 2023-12-06 MED ORDER — AMOXICILLIN-POT CLAVULANATE 875-125 MG PO TABS
1.0000 | ORAL_TABLET | Freq: Two times a day (BID) | ORAL | 0 refills | Status: DC
Start: 2023-12-06 — End: 2024-01-14

## 2023-12-06 NOTE — Assessment & Plan Note (Signed)
 An ear infection initially affected the left ear and has now progressed to the right ear, likely secondary to a recent tooth infection and root canal procedure. Despite completing a course of antibiotics for the tooth infection, the ear infection persists. He experiences tenderness in the neck and pain in the right ear. No cough or shortness of breath is present. The interconnectedness of the mouth, sinuses, and ears may have led to this secondary infection. Augmentin is prescribed for its effectiveness in treating ear, nose, and throat infections. Prednisone is considered to reduce inflammation and pain. - Prescribe Augmentin (amoxicillin/clavulanate) to be taken twice daily with food for 10 days. Advise to stop after 7 days if symptoms resolve. - Prescribe prednisone to reduce inflammation and pain. Advise to take it first thing in the morning, but it can be taken immediately upon receiving the prescription.

## 2023-12-06 NOTE — Patient Instructions (Signed)

## 2023-12-06 NOTE — Progress Notes (Signed)
 Annella Kief, DNP, AGNP-c [facility] [address] [place], [place] [address] [phone]   ACUTE VISIT- ESTABLISHED PATIENT  Blood pressure 128/78, pulse 78, weight 201 lb 12.8 oz (91.5 kg).  Subjective:  HPI  History of Present Illness Seth Ferguson is a 58 year old male who presents with persistent ear pain following a tooth infection and root canal. He is accompanied by his grandson.  He has been experiencing persistent ear pain that began after a tooth infection and subsequent root canal treatment. Initially, he had a severe tooth infection that necessitated a root canal. He completed the first part of the root canal and was prescribed antibiotics for the infection, but has not yet received the crown for the second part of the procedure.  Following the dental infection, he developed an ear infection. His nose was swollen, and he experienced pain in his ear, initially on the right side, which has since moved to the left ear. Despite completing the antibiotic course, the pain has persisted for two weeks. He cannot recall the specific antibiotic he was previously prescribed but does not believe it was Augmentin.  He is responsible for watching his two grandchildren daily, which impacts his ability to seek medical care promptly. Today was the only day he did not have to watch his granddaughter, allowing him to attend the appointment. He has full custody of his grandson due to the child's parents' legal troubles and has been caring for him since he was two months old. He also watches his grandson's younger siblings, indicating a significant caregiving role in his family.  He mentions using inhalers that contain steroids, indicating a history of respiratory issues. No cough or shortness of breath.  ROS negative except for what is listed in HPI. History, Medications, Surgery, SDOH, and Family History reviewed and updated as appropriate.  Objective:  Physical  Exam Vitals and nursing note reviewed.  Constitutional:      General: He is not in acute distress.    Appearance: Normal appearance. He is not ill-appearing.  HENT:     Head: Normocephalic and atraumatic.     Right Ear: Tenderness present. A middle ear effusion is present. Tympanic membrane is bulging.     Left Ear: A middle ear effusion is present. Tympanic membrane is bulging.     Nose: Congestion present.     Mouth/Throat:     Mouth: Mucous membranes are moist.     Pharynx: Oropharynx is clear. Posterior oropharyngeal erythema present.   Eyes:     Conjunctiva/sclera: Conjunctivae normal.    Cardiovascular:     Rate and Rhythm: Normal rate and regular rhythm.     Pulses: Normal pulses.     Heart sounds: Normal heart sounds.  Pulmonary:     Effort: Pulmonary effort is normal.     Breath sounds: Normal breath sounds. No wheezing.  Abdominal:     Palpations: Abdomen is soft.  Lymphadenopathy:     Cervical: Cervical adenopathy present.   Skin:    General: Skin is warm and dry.     Capillary Refill: Capillary refill takes less than 2 seconds.   Neurological:     General: No focal deficit present.     Mental Status: He is alert and oriented to person, place, and time.   Psychiatric:        Mood and Affect: Mood normal.         Assessment & Plan:   Problem List Items Addressed This Visit  Non-recurrent acute suppurative otitis media of both ears without spontaneous rupture of tympanic membranes - Primary   An ear infection initially affected the left ear and has now progressed to the right ear, likely secondary to a recent tooth infection and root canal procedure. Despite completing a course of antibiotics for the tooth infection, the ear infection persists. He experiences tenderness in the neck and pain in the right ear. No cough or shortness of breath is present. The interconnectedness of the mouth, sinuses, and ears may have led to this secondary infection. Augmentin  is prescribed for its effectiveness in treating ear, nose, and throat infections. Prednisone is considered to reduce inflammation and pain. - Prescribe Augmentin (amoxicillin/clavulanate) to be taken twice daily with food for 10 days. Advise to stop after 7 days if symptoms resolve. - Prescribe prednisone to reduce inflammation and pain. Advise to take it first thing in the morning, but it can be taken immediately upon receiving the prescription.      Relevant Medications   amoxicillin-clavulanate (AUGMENTIN) 875-125 MG tablet   predniSONE (DELTASONE) 20 MG tablet      Annella Kief, DNP, AGNP-c

## 2023-12-31 ENCOUNTER — Other Ambulatory Visit: Payer: Self-pay

## 2023-12-31 DIAGNOSIS — J4540 Moderate persistent asthma, uncomplicated: Secondary | ICD-10-CM

## 2023-12-31 MED ORDER — MONTELUKAST SODIUM 10 MG PO TABS
ORAL_TABLET | ORAL | 0 refills | Status: DC
Start: 2023-12-31 — End: 2024-04-06

## 2024-01-02 ENCOUNTER — Encounter: Admitting: Medical

## 2024-01-13 ENCOUNTER — Encounter: Payer: Self-pay | Admitting: Medical

## 2024-01-14 ENCOUNTER — Encounter: Payer: Self-pay | Admitting: Medical

## 2024-01-14 ENCOUNTER — Ambulatory Visit: Admitting: Medical

## 2024-01-14 VITALS — BP 120/74 | HR 88 | Temp 97.6°F | Ht 71.0 in | Wt 202.2 lb

## 2024-01-14 DIAGNOSIS — Z0000 Encounter for general adult medical examination without abnormal findings: Secondary | ICD-10-CM

## 2024-01-14 DIAGNOSIS — J4540 Moderate persistent asthma, uncomplicated: Secondary | ICD-10-CM | POA: Diagnosis not present

## 2024-01-14 DIAGNOSIS — L0292 Furuncle, unspecified: Secondary | ICD-10-CM

## 2024-01-14 DIAGNOSIS — Z13220 Encounter for screening for lipoid disorders: Secondary | ICD-10-CM | POA: Diagnosis not present

## 2024-01-14 DIAGNOSIS — Z125 Encounter for screening for malignant neoplasm of prostate: Secondary | ICD-10-CM

## 2024-01-14 DIAGNOSIS — Z131 Encounter for screening for diabetes mellitus: Secondary | ICD-10-CM

## 2024-01-14 DIAGNOSIS — J3489 Other specified disorders of nose and nasal sinuses: Secondary | ICD-10-CM

## 2024-01-14 LAB — POCT URINALYSIS DIP (PROADVANTAGE DEVICE)
Bilirubin, UA: NEGATIVE
Blood, UA: NEGATIVE
Glucose, UA: NEGATIVE mg/dL
Ketones, POC UA: NEGATIVE mg/dL
Nitrite, UA: NEGATIVE
Protein Ur, POC: NEGATIVE mg/dL
Specific Gravity, Urine: 1.01
Urobilinogen, Ur: 0.2
pH, UA: 8 (ref 5.0–8.0)

## 2024-01-14 LAB — LIPID PANEL

## 2024-01-14 MED ORDER — CEPHALEXIN 500 MG PO CAPS
500.0000 mg | ORAL_CAPSULE | Freq: Three times a day (TID) | ORAL | 0 refills | Status: AC
Start: 2024-01-14 — End: ?

## 2024-01-14 MED ORDER — MUPIROCIN 2 % EX OINT: 1.0000 | TOPICAL_OINTMENT | Freq: Two times a day (BID) | CUTANEOUS | 0 refills | Status: AC

## 2024-01-14 MED ORDER — ALBUTEROL SULFATE HFA 108 (90 BASE) MCG/ACT IN AERS
2.0000 | INHALATION_SPRAY | Freq: Four times a day (QID) | RESPIRATORY_TRACT | 0 refills | Status: DC | PRN
Start: 2024-01-14 — End: 2024-02-10

## 2024-01-14 NOTE — Progress Notes (Signed)
 Subjective: Chief Complaint  Patient presents with   Annual Exam    Fasting annual exam. Also has a boil in right underarm x 1 week. It has drained but he is asking for you to take a look at it. Has runny nose that started yesterday. Has pulled a muscle in lower right back 10 days ago. I told him you would address what you could but he is here for a CPE. Just used bathroom in waiting room, will try on way out.     Patient Care Team: Storey Stangeland, Alm GORMAN RIGGERS as PCP - General (Family Medicine) Eye doctor Dentist Dr. Gordy Starch, GI   Concerns: He started getting a boil in his right underarm about a week ago.  He has drained some but not going away yet.  This is the first time this has happened before  He has some runny nose and congestion started yesterday.  No fever.  No body aches or chills.   No Known Allergies  Past Medical History:  Diagnosis Date   Allergy    Arthritis    back   Asthma    Chronic back pain    self reported 01/2016   Hiatal hernia    Neck pain 01/2016   self reported   Sarcoidosis 1998   ?   Wears glasses     Current Outpatient Medications on File Prior to Visit  Medication Sig Dispense Refill   montelukast  (SINGULAIR ) 10 MG tablet TAKE 1 TABLET(10 MG) BY MOUTH AT BEDTIME 90 tablet 0   No current facility-administered medications on file prior to visit.      Current Outpatient Medications:    cephALEXin  (KEFLEX ) 500 MG capsule, Take 1 capsule (500 mg total) by mouth 3 (three) times daily., Disp: 30 capsule, Rfl: 0   montelukast  (SINGULAIR ) 10 MG tablet, TAKE 1 TABLET(10 MG) BY MOUTH AT BEDTIME, Disp: 90 tablet, Rfl: 0   mupirocin  ointment (BACTROBAN ) 2 %, Apply 1 Application topically 2 (two) times daily., Disp: 22 g, Rfl: 0   albuterol  (VENTOLIN  HFA) 108 (90 Base) MCG/ACT inhaler, Inhale 2 puffs into the lungs every 6 (six) hours as needed for wheezing or shortness of breath., Disp: 6.7 g, Rfl: 0  Family History  Problem Relation Age of Onset    Asthma Mother    Diabetes Mother    Other Mother        died of broken heart per son   Stroke Mother    Seizures Mother    Other Father        lung disease?   Pneumonia Father    Asthma Sister    Diabetes Sister    Asthma Brother    Diabetes Brother    Diabetes Sister    Heart disease Neg Hx    Hypertension Neg Hx    Cancer Neg Hx    Colon cancer Neg Hx    Colon polyps Neg Hx    Esophageal cancer Neg Hx    Rectal cancer Neg Hx    Stomach cancer Neg Hx     Past Surgical History:  Procedure Laterality Date   CIRCUMCISION     COLONOSCOPY  2017   Dr. Gordy Starch    Reviewed their medical, surgical, family, social, medication, and allergy history and updated chart as appropriate.  Past Medical History:  Diagnosis Date   Allergy    Arthritis    back   Asthma    Chronic back pain    self reported 01/2016  Hiatal hernia    Neck pain 01/2016   self reported   Sarcoidosis 1998   ?   Wears glasses     Past Surgical History:  Procedure Laterality Date   CIRCUMCISION     COLONOSCOPY  2017   Dr. Gordy Starch    Family History  Problem Relation Age of Onset   Asthma Mother    Diabetes Mother    Other Mother        died of broken heart per son   Stroke Mother    Seizures Mother    Other Father        lung disease?   Pneumonia Father    Asthma Sister    Diabetes Sister    Asthma Brother    Diabetes Brother    Diabetes Sister    Heart disease Neg Hx    Hypertension Neg Hx    Cancer Neg Hx    Colon cancer Neg Hx    Colon polyps Neg Hx    Esophageal cancer Neg Hx    Rectal cancer Neg Hx    Stomach cancer Neg Hx      Current Outpatient Medications:    cephALEXin  (KEFLEX ) 500 MG capsule, Take 1 capsule (500 mg total) by mouth 3 (three) times daily., Disp: 30 capsule, Rfl: 0   montelukast  (SINGULAIR ) 10 MG tablet, TAKE 1 TABLET(10 MG) BY MOUTH AT BEDTIME, Disp: 90 tablet, Rfl: 0   mupirocin  ointment (BACTROBAN ) 2 %, Apply 1 Application topically 2 (two)  times daily., Disp: 22 g, Rfl: 0   albuterol  (VENTOLIN  HFA) 108 (90 Base) MCG/ACT inhaler, Inhale 2 puffs into the lungs every 6 (six) hours as needed for wheezing or shortness of breath., Disp: 6.7 g, Rfl: 0  No Known Allergies   Review of Systems  Constitutional:  Negative for chills, fever, malaise/fatigue and weight loss.  HENT:  Positive for congestion and sore throat. Negative for ear pain, hearing loss and tinnitus.   Eyes:  Negative for blurred vision, pain and redness.  Respiratory:  Negative for cough, hemoptysis and shortness of breath.   Cardiovascular:  Negative for chest pain, palpitations, orthopnea, claudication and leg swelling.  Gastrointestinal:  Negative for abdominal pain, blood in stool, constipation, diarrhea, nausea and vomiting.  Genitourinary:  Negative for dysuria, flank pain, frequency, hematuria and urgency.  Musculoskeletal:  Negative for falls, joint pain and myalgias.  Skin:  Negative for itching and rash.  Neurological:  Negative for dizziness, tingling, speech change, weakness and headaches.  Endo/Heme/Allergies:  Negative for polydipsia. Does not bruise/bleed easily.  Psychiatric/Behavioral:  Negative for depression and memory loss. The patient is not nervous/anxious and does not have insomnia.         01/14/2024   11:22 AM 09/28/2022    8:11 AM 09/11/2021    8:51 AM 07/12/2021   11:14 AM 10/08/2019    8:15 AM  Depression screen PHQ 2/9  Decreased Interest 0 0 0 0 0  Down, Depressed, Hopeless 0 0 0 0 0  PHQ - 2 Score 0 0 0 0 0        Objective:  BP 120/74   Pulse 88   Temp 97.6 F (36.4 C) (Tympanic)   Ht 5' 11 (1.803 m)   Wt 202 lb 3.2 oz (91.7 kg)   SpO2 98%   BMI 28.20 kg/m   Wt Readings from Last 3 Encounters:  01/14/24 202 lb 3.2 oz (91.7 kg)  12/06/23 201 lb 12.8 oz (91.5 kg)  09/28/22 203 lb 6.4 oz (92.3 kg)   BP Readings from Last 3 Encounters:  01/14/24 120/74  12/06/23 128/78  09/28/22 122/70    General appearance:  alert, no distress, WD/WN, African American male Skin: Right axilla superiorly with a 3 cm diameter tender slightly draining erythematous lesion, otherwise unremarkable HEENT: normocephalic, conjunctiva/corneas normal, sclerae anicteric, PERRLA, EOM Neck: supple, no lymphadenopathy, no thyromegaly, no masses, normal ROM, no bruits Chest: non tender, normal shape and expansion Heart: RRR, normal S1, S2, no murmurs Lungs: CTA bilaterally, no wheezes, rhonchi, or rales Abdomen: +bs, soft, non tender, non distended, no masses, no hepatomegaly, no splenomegaly, no bruits Back: non tender, normal ROM, no scoliosis Musculoskeletal: no obvious deformity, normal ROM throughout, lower extremities non tender, no obvious deformity, normal ROM throughout Extremities: no edema, no cyanosis, no clubbing Pulses: 2+ symmetric, upper and lower extremities, normal cap refill Neurological: alert, oriented x 3, CN2-12 intact, strength normal upper extremities and lower extremities, sensation normal throughout, DTRs 2+ throughout, no cerebellar signs, gait normal Psychiatric: normal affect, behavior normal, pleasant  GU: Normal male, sharp, no mass or lymphadenopathy.  There is questionable hernia on the right inguinal region Rectal - deferred/declined     Assessment and Plan :   Encounter Diagnoses  Name Primary?   Encounter for health maintenance examination in adult Yes   Screening for lipid disorders    Screening for prostate cancer    Moderate persistent chronic asthma without complication    Screening for diabetes mellitus    Boil    Rhinorrhea      This visit was a preventative care visit, also known as wellness visit or routine physical.   Topics typically include healthy lifestyle, diet, exercise, preventative care, vaccinations, sick and well care, proper use of emergency dept and after hours care, as well as other concerns.     Recommendations: Continue to return yearly for your annual  wellness and preventative care visits.  This gives us  a chance to discuss healthy lifestyle, exercise, vaccinations, review your chart record, and perform screenings where appropriate.  I recommend you see your eye doctor yearly for routine vision care.  I recommend you see your dentist yearly for routine dental care including hygiene visits twice yearly.   Vaccination recommendations were reviewed Immunization History  Administered Date(s) Administered   Influenza-Unspecified 05/18/2021   PFIZER(Purple Top)SARS-COV-2 Vaccination 09/10/2019, 10/05/2019   Pneumococcal Polysaccharide-23 07/12/2021   Tdap 12/08/2019   Zoster Recombinant(Shingrix) 12/08/2019, 02/09/2020    Screening for cancer: I reviewed your colonoscopy on file that is up to date from 2017, due repeat 2027.   We discussed PSA, prostate exam, and prostate cancer screening risks/benefits.     Skin cancer screening: Check your skin regularly for new changes, growing lesions, or other lesions of concern Come in for evaluation if you have skin lesions of concern.  Lung cancer screening: If you have a greater than 20 pack year history of tobacco use, then you may qualify for lung cancer screening with a chest CT scan.   Please call your insurance company to inquire about coverage for this test.  We currently don't have screenings for other cancers besides breast, cervical, colon, and lung cancers.  If you have a strong family history of cancer or have other cancer screening concerns, please let me know.    Bone health: Get at least 150 minutes of aerobic exercise weekly Get weight bearing exercise at least once weekly Bone density test:  A bone density test is  an imaging test that uses a type of X-ray to measure the amount of calcium and other minerals in your bones. The test may be used to diagnose or screen you for a condition that causes weak or thin bones (osteoporosis), predict your risk for a broken bone  (fracture), or determine how well your osteoporosis treatment is working. The bone density test is recommended for females 65 and older, or females or males <65 if certain risk factors such as thyroid disease, long term use of steroids such as for asthma or rheumatological issues, vitamin D deficiency, estrogen deficiency, family history of osteoporosis, self or family history of fragility fracture in first degree relative.    Heart health: Get at least 150 minutes of aerobic exercise weekly Limit alcohol It is important to maintain a healthy blood pressure and healthy cholesterol numbers  Heart disease screening: Screening for heart disease includes screening for blood pressure, fasting lipids, glucose/diabetes screening, BMI height to weight ratio, reviewed of smoking status, physical activity, and diet.    Goals include blood pressure 120/80 or less, maintaining a healthy lipid/cholesterol profile, preventing diabetes or keeping diabetes numbers under good control, not smoking or using tobacco products, exercising most days per week or at least 150 minutes per week of exercise, and eating healthy variety of fruits and vegetables, healthy oils, and avoiding unhealthy food choices like fried food, fast food, high sugar and high cholesterol foods.    Other tests may possibly include EKG test, CT coronary calcium score, echocardiogram, exercise treadmill stress test.   Consider CT coronary heart screening   Medical care options: I recommend you continue to seek care here first for routine care.  We try really hard to have available appointments Monday through Friday daytime hours for sick visits, acute visits, and physicals.  Urgent care should be used for after hours and weekends for significant issues that cannot wait till the next day.  The emergency department should be used for significant potentially life-threatening emergencies.  The emergency department is expensive, can often have long  wait times for less significant concerns, so try to utilize primary care, urgent care, or telemedicine when possible to avoid unnecessary trips to the emergency department.  Virtual visits and telemedicine have been introduced since the pandemic started in 2020, and can be convenient ways to receive medical care.  We offer virtual appointments as well to assist you in a variety of options to seek medical care.   Advanced Directives: I recommend you consider completing a Health Care Power of Attorney and Living Will.   These documents respect your wishes and help alleviate burdens on your loved ones if you were to become terminally ill or be in a position to need those documents enforced.    You can complete Advanced Directives yourself, have them notarized, then have copies made for our office, for you and for anybody you feel should have them in safe keeping.  Or, you can have an attorney prepare these documents.   If you haven't updated your Last Will and Testament in a while, it may be worthwhile having an attorney prepare these documents together and save on some costs.       Separate significant issues discussed: Boil of skin on the right-advise warm compresses for the next few days, begin mupirocin  ointment.  If not much improved in the next 3 to 4 days work for starting oral antibiotic Keflex .  Rhinorrhea-Advise nasal saline, Sudafed or Flonase over-the-counter for the next 5 to 7  days.  Continue Singulair  as usual.  Recheck if worsening or not improving  Asthma-mild intermittent, no particular issues.  Refilled albuterol  for as needed use  Seth Ferguson was seen today for annual exam.  Diagnoses and all orders for this visit:  Encounter for health maintenance examination in adult -     Comprehensive metabolic panel with GFR -     CBC -     Lipid panel -     PSA -     Hemoglobin A1c -     Urinalysis, Routine w reflex microscopic -     POCT Urinalysis DIP ([employer]  Device)  Screening for lipid disorders -     Lipid panel  Screening for prostate cancer -     PSA  Moderate persistent chronic asthma without complication  Screening for diabetes mellitus -     Hemoglobin A1c  Boil  Rhinorrhea  Other orders -     mupirocin  ointment (BACTROBAN ) 2 %; Apply 1 Application topically 2 (two) times daily. -     cephALEXin  (KEFLEX ) 500 MG capsule; Take 1 capsule (500 mg total) by mouth 3 (three) times daily. -     albuterol  (VENTOLIN  HFA) 108 (90 Base) MCG/ACT inhaler; Inhale 2 puffs into the lungs every 6 (six) hours as needed for wheezing or shortness of breath.    Follow-up pending labs, yearly for physical

## 2024-01-15 ENCOUNTER — Ambulatory Visit: Payer: Self-pay | Admitting: Medical

## 2024-01-15 LAB — CBC
Hematocrit: 44.3 % (ref 37.5–51.0)
Hemoglobin: 14.5 g/dL (ref 13.0–17.7)
MCH: 31 pg (ref 26.6–33.0)
MCHC: 32.7 g/dL (ref 31.5–35.7)
MCV: 95 fL (ref 79–97)
Platelets: 254 x10E3/uL (ref 150–450)
RBC: 4.67 x10E6/uL (ref 4.14–5.80)
RDW: 13.3 % (ref 11.6–15.4)
WBC: 5.1 x10E3/uL (ref 3.4–10.8)

## 2024-01-15 LAB — LIPID PANEL
Cholesterol, Total: 145 mg/dL (ref 100–199)
HDL: 46 mg/dL (ref >39)
LDL CALC COMMENT:: 3.2 ratio (ref 0.0–5.0)
LDL Chol Calc (NIH): 84 mg/dL (ref 0–99)
Triglycerides: 74 mg/dL (ref 0–149)
VLDL Cholesterol Cal: 15 mg/dL (ref 5–40)

## 2024-01-15 LAB — URINALYSIS, ROUTINE W REFLEX MICROSCOPIC
Specific Gravity, UA: 1.021 (ref 1.005–1.030)
Urobilinogen, Ur: 0.2 mg/dL (ref 0.2–1.0)
pH, UA: 8.5 — AB (ref 5.0–7.5)

## 2024-01-15 LAB — COMPREHENSIVE METABOLIC PANEL WITH GFR
ALT: 25 IU/L (ref 0–44)
AST: 25 IU/L (ref 0–40)
Albumin: 4.6 g/dL (ref 3.8–4.9)
Alkaline Phosphatase: 53 IU/L (ref 44–121)
BUN/Creatinine Ratio: 16 (ref 9–20)
BUN: 17 mg/dL (ref 6–24)
Bilirubin Total: 0.3 mg/dL (ref 0.0–1.2)
CO2: 23 mmol/L (ref 20–29)
Calcium: 9.4 mg/dL (ref 8.7–10.2)
Chloride: 96 mmol/L (ref 96–106)
Creatinine, Ser: 1.09 mg/dL (ref 0.76–1.27)
Globulin, Total: 3.3 g/dL (ref 1.5–4.5)
Glucose: 88 mg/dL (ref 70–99)
Potassium: 4.4 mmol/L (ref 3.5–5.2)
Sodium: 135 mmol/L (ref 134–144)
Total Protein: 7.9 g/dL (ref 6.0–8.5)
eGFR: 79 mL/min/1.73 (ref >59)

## 2024-01-15 LAB — PSA: Prostate Specific Ag, Serum: 0.3 ng/mL (ref 0.0–4.0)

## 2024-01-15 LAB — HEMOGLOBIN A1C
Est. average glucose Bld gHb Est-mCnc: 131 mg/dL
Hgb A1c MFr Bld: 6.2 — AB (ref 4.8–5.6)

## 2024-01-15 NOTE — Progress Notes (Signed)
Results through My Chart

## 2024-02-10 ENCOUNTER — Other Ambulatory Visit: Payer: Self-pay | Admitting: Medical

## 2024-04-06 ENCOUNTER — Other Ambulatory Visit: Payer: Self-pay | Admitting: Medical

## 2024-04-06 DIAGNOSIS — J4540 Moderate persistent asthma, uncomplicated: Secondary | ICD-10-CM

## 2024-06-12 ENCOUNTER — Other Ambulatory Visit: Payer: Self-pay | Admitting: Medical

## 2024-06-12 NOTE — Telephone Encounter (Signed)
 Duplicate request

## 2024-06-12 NOTE — Telephone Encounter (Signed)
 Is this okay to refill?

## 2025-01-26 ENCOUNTER — Encounter: Payer: Self-pay | Admitting: Medical
